# Patient Record
Sex: Male | Born: 1980 | Race: White | Hispanic: No | Marital: Married | Smoking: Current some day smoker
Health system: Southern US, Community
[De-identification: ages and names within clinical notes are randomized; demographics above are authoritative.]

## PROBLEM LIST (undated history)

## (undated) DIAGNOSIS — J45909 Unspecified asthma, uncomplicated: Secondary | ICD-10-CM

## (undated) HISTORY — DX: Unspecified asthma, uncomplicated: J45.909

## (undated) HISTORY — PX: HERNIA REPAIR: SHX51

---

## 2015-11-27 ENCOUNTER — Ambulatory Visit (HOSPITAL_COMMUNITY)
Admission: RE | Admit: 2015-11-27 | Discharge: 2015-11-27 | Disposition: A | Discharge status: Home / Self Care | Source: Ambulatory Visit | Attending: Allergy and Immunology | Admitting: Allergy and Immunology

## 2015-11-27 ENCOUNTER — Ambulatory Visit (INDEPENDENT_AMBULATORY_CARE_PROVIDER_SITE_OTHER): Admitting: Allergy and Immunology

## 2015-11-27 ENCOUNTER — Encounter: Payer: Self-pay | Admitting: Allergy and Immunology

## 2015-11-27 VITALS — BP 110/68 | HR 80 | Temp 98.3°F | Resp 16 | Ht 70.47 in | Wt 220.5 lb

## 2015-11-27 DIAGNOSIS — H1010 Acute atopic conjunctivitis, unspecified eye: Secondary | ICD-10-CM

## 2015-11-27 DIAGNOSIS — J4531 Mild persistent asthma with (acute) exacerbation: Secondary | ICD-10-CM | POA: Diagnosis not present

## 2015-11-27 DIAGNOSIS — J309 Allergic rhinitis, unspecified: Secondary | ICD-10-CM | POA: Diagnosis not present

## 2015-11-27 MED ORDER — FLUTICASONE FUROATE 200 MCG/ACT IN AEPB: 1.0000 | INHALATION_SPRAY | Freq: Every day | RESPIRATORY_TRACT | Status: DC

## 2015-11-27 MED ORDER — ALBUTEROL SULFATE HFA 108 (90 BASE) MCG/ACT IN AERS: 2.0000 | INHALATION_SPRAY | RESPIRATORY_TRACT | Status: DC | PRN

## 2015-11-27 MED ORDER — MONTELUKAST SODIUM 10 MG PO TABS: 10.0000 mg | ORAL_TABLET | Freq: Every day | ORAL | Status: DC

## 2015-11-27 MED ORDER — METHYLPREDNISOLONE ACETATE 80 MG/ML IJ SUSP
80.0000 mg | Freq: Once | INTRAMUSCULAR | Status: AC
  Administered 2015-11-27: 80 mg via INTRAMUSCULAR

## 2015-11-27 NOTE — Progress Notes (Signed)
Dear Dr. Mardelle Matte,  Thank you for referring Nathaniel Lawson to the [facility] [place] on 11/27/2015.   Below is a summation of this patient's evaluation and recommendations.  Thank you for your referral. I will keep you informed about this patient's response to treatment.   If you have any questions please to do hestitate to contact me.   Sincerely,  Jessica Priest, MD [facility]   ______________________________________________________________________    NEW PATIENT NOTE  Referring Provider: Willow Ora, MD Primary Provider: Willow Ora, MD Date of office visit: 11/27/2015    Subjective:   Chief Complaint:  Nathaniel Lawson (DOB: [birth year]) is a 35 y.o. male with a chief complaint of New Patient (Initial Visit)  who presents to the clinic on 11/27/2015 with the following problems:  HPI Comments: Nathaniel Lawson presents to this clinic in evaluation of respiratory tract symptoms. He has a several year history of problems with runny nose and occasional sneezing and occasional nasal congestion without any anosmia and without any headaches and without ugly nasal discharge usually occurring on a perennial basis with seasonal exacerbation during the spring. He's been treated with Flonase and Zyrtec which has helped him somewhat. There is no obvious provoking factors giving rise to his symptoms.  In addition, he's been having cough since January. He feels a tickle in his chest and may be a tickle in his throat. He does not get any chest tightness or shortness of breath. He does not have any throat clearing or postnasal drip or raspy voice. He does not have any gagging or retching or posttussive emesis. There is no history of childhood asthma. He has not had a chest x-ray. No therapy to date has helped this cough.   History reviewed. No pertinent past medical history.  Past Surgical History  Procedure  Laterality Date  . Hernia repair        Medication List           cetirizine 10 MG tablet  Commonly known as:  ZYRTEC  Take 10 mg by mouth daily.     fluticasone 50 MCG/ACT nasal spray  Commonly known as:  FLONASE  Place 2 sprays into both nostrils daily.        No Known Allergies  Review of systems negative except as noted in HPI / PMHx or noted below:  Review of Systems  Constitutional: Negative.   HENT: Negative.   Eyes: Negative.   Respiratory: Negative.   Cardiovascular: Negative.   Gastrointestinal: Negative.   Genitourinary: Negative.   Musculoskeletal: Negative.   Skin: Negative.   Neurological: Negative.   Endo/Heme/Allergies: Negative.   Psychiatric/Behavioral: Negative.     Family History  Problem Relation Age of Onset  . Allergic rhinitis Neg Hx   . Angioedema Neg Hx   . Atopy Neg Hx   . Asthma Neg Hx   . Eczema Neg Hx   . Immunodeficiency Neg Hx   . Urticaria Neg Hx     Social History   Social History  . Marital Status: Married    Spouse Name: N/A  . Number of Children: N/A  . Years of Education: N/A   Occupational History  . Not on file.   Social History Main Topics  . Smoking status: Former Smoker    Quit date: 03/24/2014  . Smokeless tobacco: Not on file  . Alcohol Use: 0.0 oz/week    0 Standard drinks  or equivalent per week  . Drug Use: No  . Sexual Activity: Not on file   Other Topics Concern  . Not on file   Social History Narrative  . 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   Objective:   Filed Vitals:   11/27/15 0842  BP: 110/68  Pulse: 80  Temp: 98.3 F (36.8 C)  Resp: 16   Height: 5' 10.47" (179 cm) Weight: 220 lb 7.4 oz (100 kg)  Physical Exam  Constitutional: He is  well-developed, well-nourished, and in no distress.  HENT:  Head: Normocephalic. Head is without right periorbital erythema and without left periorbital erythema.  Right Ear: Tympanic membrane, external ear and ear canal normal.  Left Ear: Tympanic membrane, external ear and ear canal normal.  Nose: Nose normal. No mucosal edema or rhinorrhea.  Mouth/Throat: Oropharynx is clear and moist and mucous membranes are normal. No oropharyngeal exudate.  Eyes: Conjunctivae and lids are normal. Pupils are equal, round, and reactive to light.  Neck: Trachea normal. No tracheal deviation present. No thyromegaly present.  Cardiovascular: Normal rate, regular rhythm, S1 normal, S2 normal and normal heart sounds.   No murmur heard. Pulmonary/Chest: Effort normal. No stridor. No tachypnea. No respiratory distress. He has no wheezes. He has no rales. He exhibits no tenderness.  Abdominal: Soft. He exhibits no distension and no mass. There is no hepatosplenomegaly. There is no tenderness. There is no rebound and no guarding.  Musculoskeletal: He exhibits no edema or tenderness.  Lymphadenopathy:       Head (right side): No tonsillar adenopathy present.       Head (left side): No tonsillar adenopathy present.    He has no cervical adenopathy.    He has no axillary adenopathy.  Neurological: He is alert. Gait normal.  Skin: No rash noted. He is not diaphoretic. No erythema. No pallor. Nails show no clubbing.  Psychiatric: Mood and affect normal.     Diagnostics: Allergy skin tests were performed. He did not demonstrate any hypersensitivity against a screening panel of aeroallergens or foods  Spirometry was performed and demonstrated an FEV1 of 2.98 @ 68 % of predicted. Following the administration of nebulized albuterol his FEV1 rose to 4.20 which was an increase in the FEV1 of 41%.  The patient had an Asthma Control Test with the following results:  .     Assessment and Plan:    1. Asthma, not well  controlled, mild persistent, with acute exacerbation   2. Allergic rhinoconjunctivitis     1. Allergen avoidance measures?  2. Treat and prevent inflammation:   A. montelukast 10 mg one tablet one time per day  B. Flonase 2 sprays each nostril one time per day  C. Arnuity 200 one inhalation 1 time per day  D. Depo-Medrol 80 IM delivered in clinic today  3. If needed:   A. ProAir HFA 2 puffs every 4-6 hours  B. OTC antihistamine - Claritin/Zyrtec/Allegra  4. Obtain a chest x-ray  5. Return to clinic in 4 weeks or earlier if problem  6. Laryngopharyngeal reflux?  Dhairya has an irritated and inflamed airway and we'll treat him with the therapy noted above and obtain a chest x-ray to rule out a significant condition other  than inflammation contributing to his respiratory tract symptoms. He may have a component of LPR which may need to be addressed if he does not have an adequate response to the medical therapy noted above. I will regroup with him in 4 weeks or earlier if there is a problem.  Jessica PriestEric J. Kozlow, MD [facility] [place] [place]

## 2015-11-27 NOTE — Patient Instructions (Signed)
  1. Allergen avoidance measures  2. Treat and prevent inflammation:   A. montelukast 10 mg one tablet one time per day  B. Flonase 2 sprays each nostril one time per day  C. Arnuity 200 one inhalation 1 time per day  D. Depo-Medrol 80 IM delivered in clinic today  3. If needed:   A. ProAir HFA 2 puffs every 4-6 hours  B. OTC antihistamine - Claritin/Zyrtec/Allegra  4. Obtain a chest x-ray  5. Return to clinic in 4 weeks or earlier if problem  6. Laryngopharyngeal reflux?

## 2015-11-28 ENCOUNTER — Telehealth: Payer: Self-pay

## 2015-11-28 NOTE — Telephone Encounter (Signed)
See result note.  

## 2015-11-28 NOTE — Telephone Encounter (Signed)
Patient was returning phone call for xray results.  Thanks

## 2015-12-04 ENCOUNTER — Telehealth: Payer: Self-pay | Admitting: *Deleted

## 2015-12-04 MED ORDER — FLUTICASONE PROPIONATE HFA 220 MCG/ACT IN AERO: 2.0000 | INHALATION_SPRAY | Freq: Every day | RESPIRATORY_TRACT | Status: DC

## 2015-12-04 NOTE — Telephone Encounter (Signed)
Patient insurance will not cover Arnuity. Per Dr. Lucie LeatherKozlow switch to Floven 220 mcg 2 puffs daily. Sent script into [facility]. Patient informed

## 2015-12-25 ENCOUNTER — Encounter: Payer: Self-pay | Admitting: Allergy and Immunology

## 2015-12-25 ENCOUNTER — Ambulatory Visit (INDEPENDENT_AMBULATORY_CARE_PROVIDER_SITE_OTHER): Admitting: Allergy and Immunology

## 2015-12-25 VITALS — BP 110/68 | HR 78 | Resp 16

## 2015-12-25 DIAGNOSIS — J4530 Mild persistent asthma, uncomplicated: Secondary | ICD-10-CM | POA: Diagnosis not present

## 2015-12-25 DIAGNOSIS — J3089 Other allergic rhinitis: Secondary | ICD-10-CM

## 2015-12-25 MED ORDER — CETIRIZINE HCL 10 MG PO TABS: 10.0000 mg | ORAL_TABLET | Freq: Every day | ORAL | Status: DC

## 2015-12-25 NOTE — Patient Instructions (Signed)
  1. Allergen avoidance measures? - Check Zone 3 allergy profile  2. Treat and prevent inflammation:   A. montelukast 10 mg one tablet one time per day  B. Flonase 2 sprays each nostril one time per day  3. If needed:   A. ProAir HFA 2 puffs every 4-6 hours  B. OTC antihistamine - Claritin/Zyrtec/Allegra  4. Stop Flovent. Can also stop and restart montelukast.  5. Return to clinic in 12 weeks or earlier if problem

## 2015-12-25 NOTE — Progress Notes (Signed)
Follow-up Note  Referring Provider: Willow Ora, MD Primary Provider: Willow Ora, MD Date of Office Visit: 12/25/2015  Subjective:   Nathaniel Lawson (DOB: 1981/02/19) is a 35 y.o. male who [facility] 12[facility]/2015 in re-evaluation of the following:  HPI: Nathaniel Lawson returns to this clinic in reevaluation of his rhinitis and cough. He believes that his nose is basically the same since starting medical therapy prescribed 11/28/2015. He does not think that he's had any additional improvement with the introduction of montelukast compared to using his Zyrtec and Flonase. He no longer has a cough. He has been using an inhaled steroid, initially Arnuity and subsequently Flovent. He thinks that his cough was actually improving before starting any inhaled steroids. He does not believe that he has asthma given the fact that he has no exercise-induced bronchospastic symptoms, cold air induced bronchospastic symptoms and has never had a problem with cough up until January of this year. His chest x-ray obtained on 11/27/2015 identified no significant abnormality    Medication List       This list is accurate as of: 12/25/15  8:56 AM.  Always use your most recent med list.               albuterol 108 (90 Base) MCG/ACT inhaler  Commonly known as:  PROAIR HFA  Inhale 2 puffs into the lungs every 4 (four) hours as needed for wheezing or shortness of breath.     cetirizine 10 MG tablet  Commonly known as:  ZYRTEC  Take 10 mg by mouth daily.     fluticasone 220 MCG/ACT inhaler  Commonly known as:  FLOVENT HFA  Inhale 2 puffs into the lungs daily.     fluticasone 50 MCG/ACT nasal spray  Commonly known as:  FLONASE  Place 2 sprays into both nostrils daily.     montelukast 10 MG tablet  Commonly known as:  SINGULAIR  Take 1 tablet (10 mg total) by mouth at bedtime.        Past Medical History  Diagnosis Date  . Asthma     Past Surgical History    Procedure Laterality Date  . Hernia repair      No Known Allergies  Review of systems negative except as noted in HPI / PMHx or noted below:  Review of Systems  Constitutional: Negative.   HENT: Negative.   Eyes: Negative.   Respiratory: Negative.   Cardiovascular: Negative.   Gastrointestinal: Negative.   Genitourinary: Negative.   Musculoskeletal: Negative.   Skin: Negative.   Neurological: Negative.   Endo/Heme/Allergies: Negative.   Psychiatric/Behavioral: Negative.      Objective:   Filed Vitals:   12/25/15 0844  BP: 110/68  Pulse: 78  Resp: 16          Physical Exam  Constitutional: He is well-developed, well-nourished, and in no distress.  HENT:  Head: Normocephalic.  Right Ear: Tympanic membrane, external ear and ear canal normal.  Left Ear: Tympanic membrane, external ear and ear canal normal.  Nose: Nose normal. No mucosal edema or rhinorrhea.  Mouth/Throat: Uvula is midline, oropharynx is clear and moist and mucous membranes are normal. No oropharyngeal exudate.  Eyes: Conjunctivae are normal.  Neck: Trachea normal. No tracheal tenderness present. No tracheal deviation present. No thyromegaly present.  Cardiovascular: Normal rate, regular rhythm, S1 normal, S2 normal and normal heart sounds.   No murmur heard. Pulmonary/Chest: Breath sounds normal. No stridor. No respiratory distress. He  has no wheezes. He has no rales.  Musculoskeletal: He exhibits no edema.  Lymphadenopathy:       Head (right side): No tonsillar adenopathy present.       Head (left side): No tonsillar adenopathy present.    He has no cervical adenopathy.  Neurological: He is alert. Gait normal.  Skin: No rash noted. He is not diaphoretic. No erythema. Nails show no clubbing.  Psychiatric: Mood and affect normal.    Diagnostics: His chest x-ray obtained on 11/27/2015 identified no significant abnormality   Spirometry was performed and demonstrated an FEV1 of 3.52 at 81 % of  predicted.  The patient had an Asthma Control Test with the following results: ACT Total Score: 24.    Assessment and Plan:   1. Asthma, well controlled, mild persistent   2. Other allergic rhinitis     1. Allergen avoidance measures? - Check Zone 3 allergy profile  2. Treat and prevent inflammation:   A. montelukast 10 mg one tablet one time per day  B. Flonase 2 sprays each nostril one time per day  3. If needed:   A. ProAir HFA 2 puffs every 4-6 hours  B. OTC antihistamine - Claritin/Zyrtec/Allegra  4. Stop Flovent. Can also stop and restart montelukast.  5. Return to clinic in 12 weeks or earlier if problem  I think we now need to have Nathaniel Lawson perform the "reverse experiment" to see if he redevelops significant respiratory tract symptoms as his Flovent is removed. I also am not completely convinced that he has a component of asthma but there are some findings that suggest there may be a component of asthma that was giving rise to his cough. I think we'll have a better idea about what going on after 8-12 weeks and I've asked Nathaniel Lawson to contact me should he have significant problems between now and when he returns to this clinic in 12 weeks. To be complete, we will check a zone 3 allergy profile to see if he does have any IgE antigen specific antibodies directed against allergens.   Laurette SchimkeEric Myria Steenbergen, MD [facility]

## 2015-12-28 LAB — ALLERGENS, ZONE 3
Alternaria Alternata IgE: <0.1 kU/L
Aspergillus Fumigatus IgE: <0.1 kU/L
Bahia Grass IgE: <0.1 kU/L
Cedar, Mountain IgE: <0.1 kU/L
Common Silver Birch IgE: <0.1 kU/L
D002-IGE D FARINAE: 0.11 kU/L — AB
Mucor Racemosus IgE: <0.1 kU/L
Nettle IgE: <0.1 kU/L
Pigweed, Rough IgE: <0.1 kU/L
Plantain, English IgE: <0.1 kU/L
Ragweed, Short IgE: <0.1 kU/L
Stemphylium Herbarum IgE: <0.1 kU/L
T001-IGE MAPLE/BOX ELDER: 0.11 kU/L — AB

## 2016-04-07 ENCOUNTER — Ambulatory Visit: Admitting: Allergy and Immunology

## 2017-02-14 IMAGING — DX DG CHEST 2V
2 series · 2 of 2 positions shown · non-contrast
Comparison: None.

CLINICAL DATA: Acute exacerbation of asthma.

EXAM:
CHEST  2 VIEW

[w chest pa]
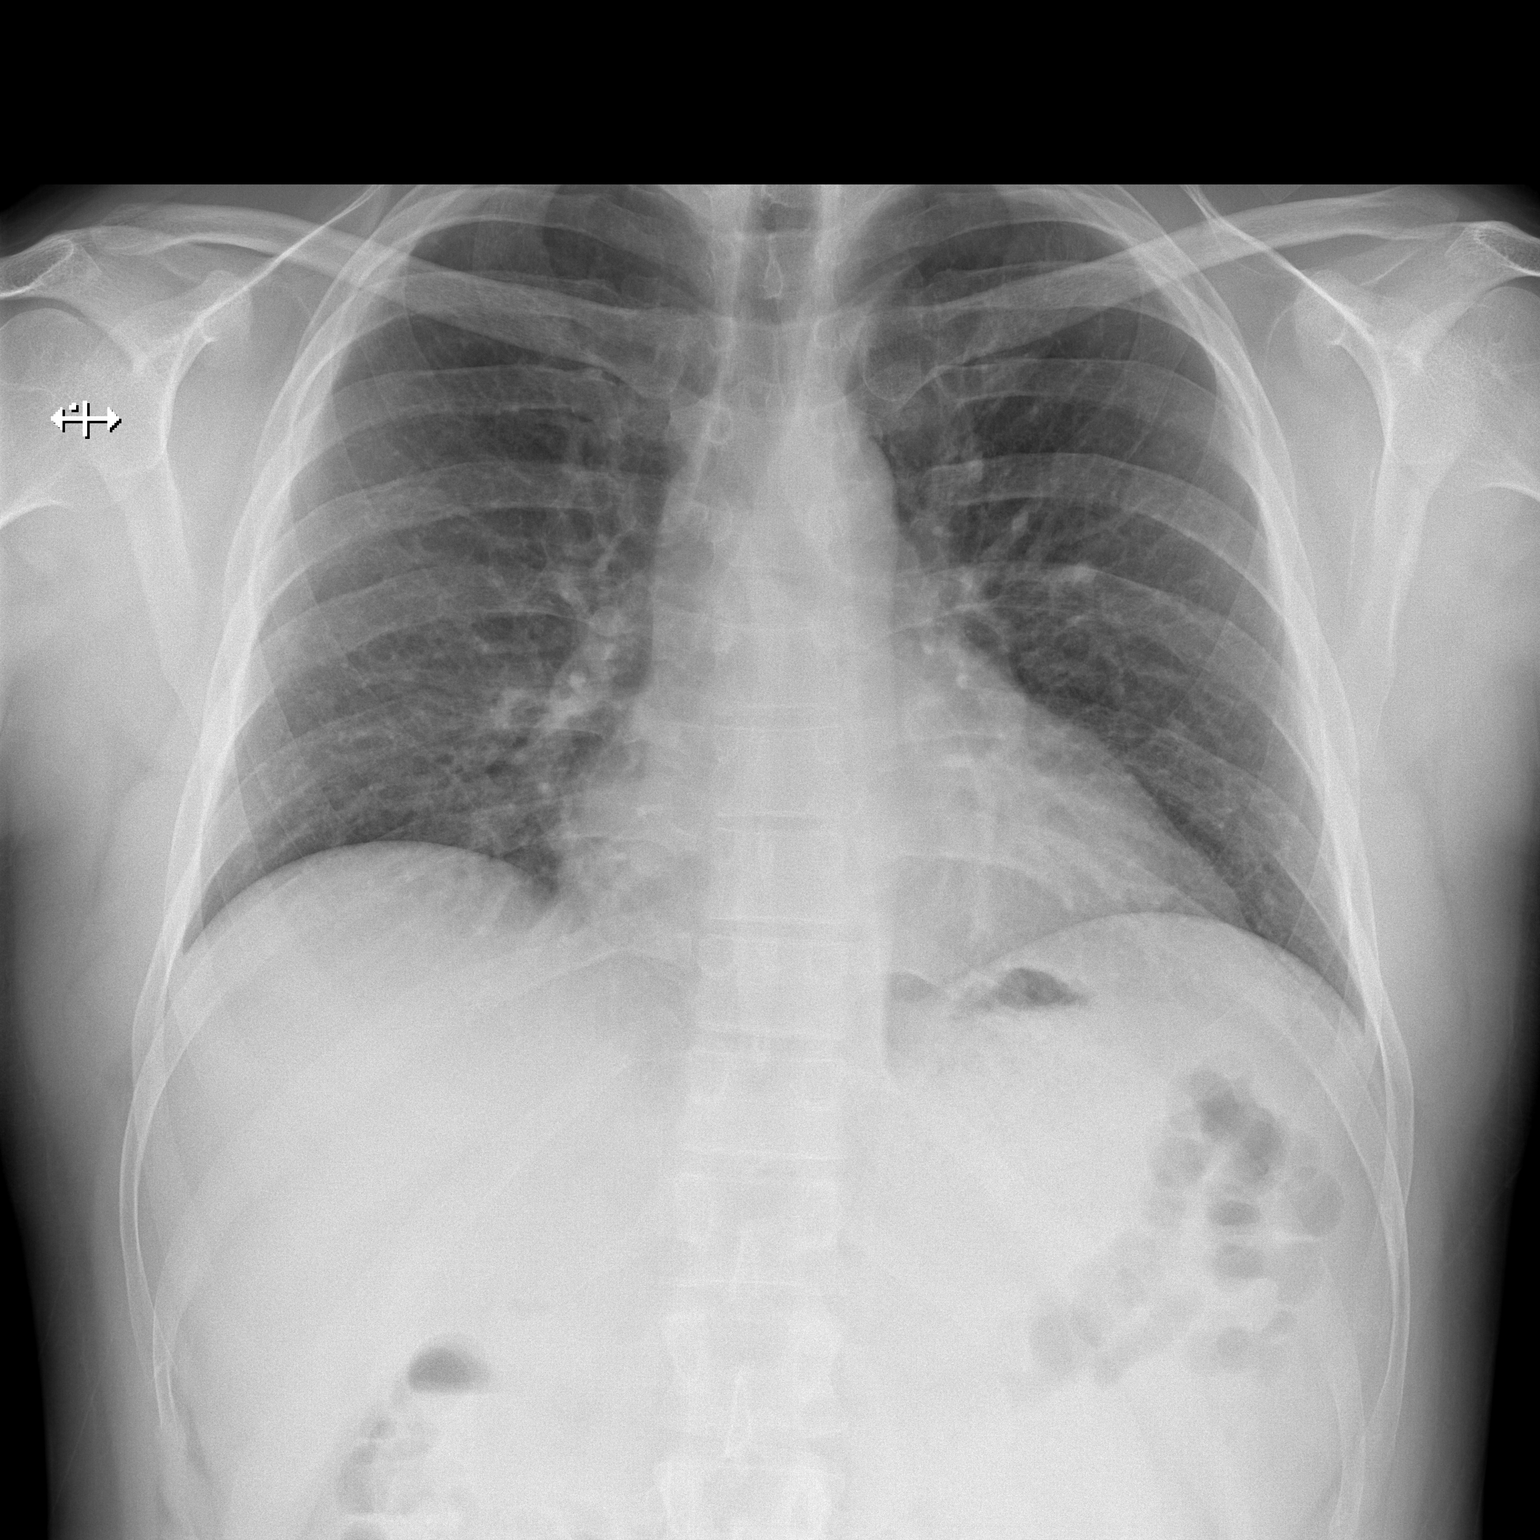

[w chest lat]
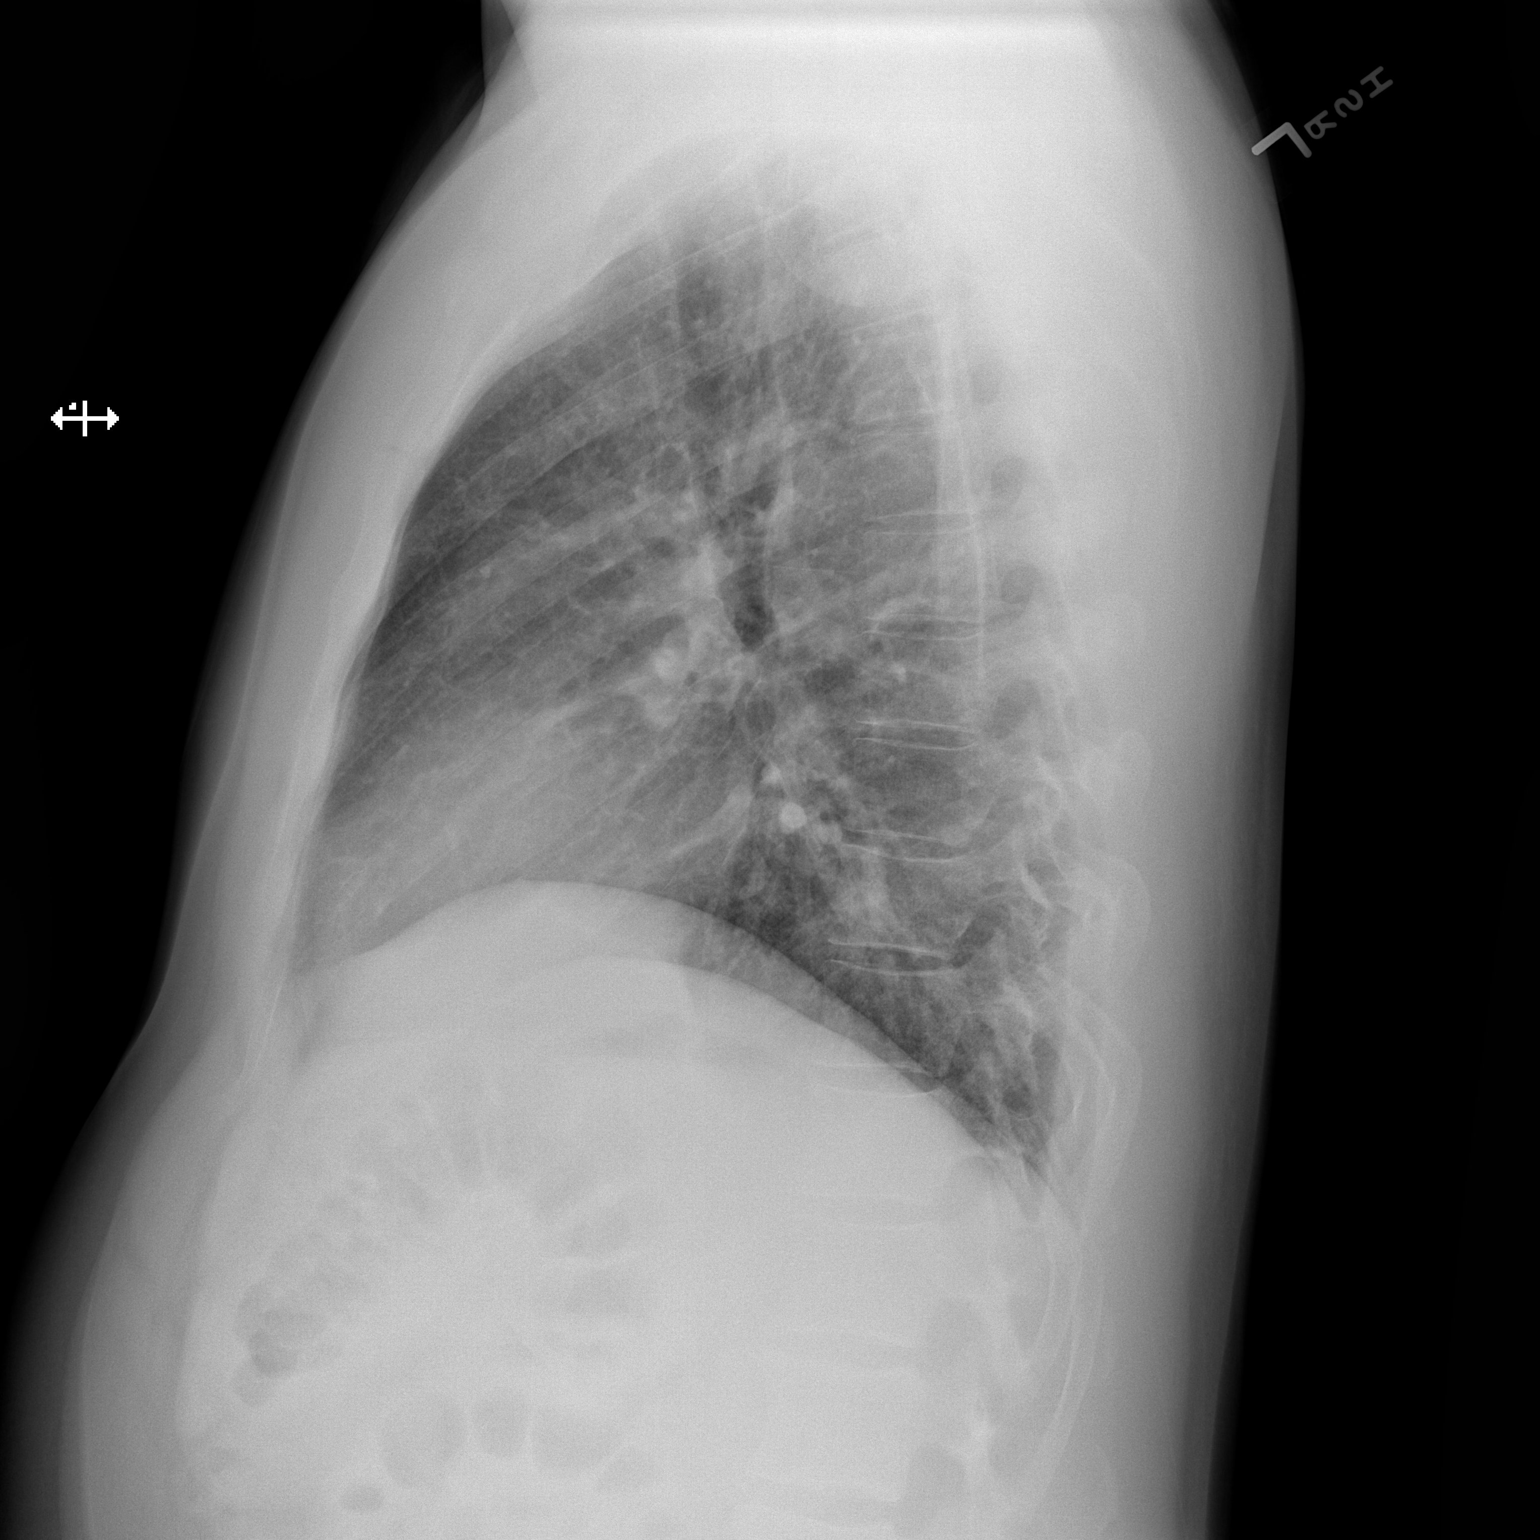

[2 of 2 positions shown; findings below may reference images not displayed]

FINDINGS: Heart size and pulmonary vascularity are normal. No infiltrates or
effusions. Lungs are clear. The bones are normal.
IMPRESSION: Normal exam.

## 2021-09-22 ENCOUNTER — Ambulatory Visit (INDEPENDENT_AMBULATORY_CARE_PROVIDER_SITE_OTHER): Admitting: Behavioral Health

## 2021-09-22 ENCOUNTER — Other Ambulatory Visit: Payer: Self-pay

## 2021-09-22 ENCOUNTER — Encounter: Payer: Self-pay | Admitting: Behavioral Health

## 2021-09-22 VITALS — BP 136/85 | HR 58 | Ht 71.0 in | Wt 215.0 lb

## 2021-09-22 DIAGNOSIS — F489 Nonpsychotic mental disorder, unspecified: Secondary | ICD-10-CM | POA: Diagnosis not present

## 2021-09-22 NOTE — Progress Notes (Signed)
[facility] MD/PA/NP Initial Note  09/22/2021 11:06 AM Tracy Pasley  MRN:  [id number]  Chief Complaint:  Chief Complaint   Establish Care; Patient Education; ADHD     HPI:  41 year old male presents to this office for initial visit and to establish care. He says that he is here today on referral from his PCP. A preliminary assessment tool had revealed he may be borderline ADHD. He says that he performed well through middle and HS with HS being very easy. Says he did not really have to study. Says he was distracted by heavy video game use in his college years and it took him approximately 10 years to complete his degree. This left him feeling discouraged about his accomplishment. Says he wife has been concerned about his memory and forgetfulness with task at home. He does endorse heavy electronic use and cell phone browsing. Says he does browse while driving. He realizes that he probably spends to much time on devices. He says he has been told by other mental health professional that he may suffer from boredom as well.  He is concerned about some of his behaviors and has been concerned about central processing disorder.  He is open to CBT or life coaching to help with possibly maximizing productivity in work and family life. Says his anxiety today is 1/10 and depression is 0/10. He does sleep 7-8 hours most of the time. He denies mania. Was negative on MDQ. No psychosis. No SI/HI.   No prior psychiatric medication trial  Visit Diagnosis:    ICD-10-CM   1. 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       Past Psychiatric History: anxiety and depression  Past Medical History:  Past Medical History:  Diagnosis Date   Asthma     Past Surgical History:  Procedure Laterality Date   HERNIA REPAIR      Family Psychiatric History: none noted this visit  Family History:  Family History  Problem Relation Age of Onset   Allergic rhinitis Neg Hx    Angioedema Neg Hx    Atopy Neg Hx    Asthma Neg Hx     Eczema Neg Hx    Immunodeficiency Neg Hx    Urticaria Neg Hx     Social History:  Social History   Socioeconomic History   Marital status: Married    Spouse name: Janett Billow   Number of children: 2   Years of education: 16   Highest education level: Bachelor's degree (e.g., BA, AB, BS)  Occupational History   Not on file  Tobacco Use   Smoking status: Former    Types: Cigarettes    Quit date: 03/24/2014    Years since quitting: 7.5   Smokeless tobacco: Not on file  Vaping Use   Vaping Use: Never used  Substance and Sexual Activity   Alcohol use: Yes    Alcohol/week: 0.0 standard drinks   Drug use: No   Sexual activity: Yes  Other Topics Concern   Not on file  Social History Narrative   Lives in [place] with wife and two children 55 and 39 years of age. 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    Social Determinants of Health   Financial Resource Strain: Not on file  Food Insecurity: Not on file  Transportation Needs: Not on file  Physical Activity: Not on file  Stress: Not on file  Social Connections: Not on file    Allergies: No Known Allergies  Metabolic Disorder Labs:  No results found for: HGBA1C, MPG No results found for: PROLACTIN No results found for: CHOL, TRIG, HDL, CHOLHDL, VLDL, LDLCALC No results found for: TSH  Therapeutic Level Labs: No results found for: LITHIUM No results found for: VALPROATE No components found for:  CBMZ  Current Medications: Current Outpatient Medications  Medication Sig Dispense Refill   albuterol (PROAIR HFA) 108 (90 Base) MCG/ACT inhaler Inhale 2 puffs into the lungs every 4 (four) hours as needed for wheezing or shortness of breath. 1 Inhaler 1   cetirizine (ZYRTEC) 10 MG tablet Take 10 mg by mouth daily.     cetirizine (ZYRTEC) 10 MG tablet Take 1 tablet (10 mg total) by mouth daily. 30 tablet 5   fluticasone (FLONASE) 50 MCG/ACT nasal spray Place 2 sprays into both nostrils daily.     fluticasone (FLOVENT HFA) 220  MCG/ACT inhaler Inhale 2 puffs into the lungs daily. 1 Inhaler 5   Fluticasone Furoate (ARNUITY ELLIPTA) 200 MCG/ACT AEPB Inhale 1 puff into the lungs daily. 30 each 5   montelukast (SINGULAIR) 10 MG tablet Take 1 tablet (10 mg total) by mouth at bedtime. 30 tablet 5   No current facility-administered medications for this visit.    Medication Side Effects: none  Orders placed this visit:  No orders of the defined types were placed in this encounter.   Psychiatric Specialty Exam:  Review of Systems  Constitutional: Negative.   Allergic/Immunologic: Negative.   Neurological: Negative.   Psychiatric/Behavioral: Negative.     Blood pressure 136/85, pulse (!) 58, height 5\' 11"  (1.803 m), weight 215 lb (97.5 kg).Body mass index is 29.99 kg/m.  General Appearance: Casual and Neat  Eye Contact:  Good  Speech:  Clear and Coherent  Volume:  Normal  Mood:  NA  Affect:  Appropriate  Thought Process:  Coherent  Orientation:  Full (Time, Place, and Person)  Thought Content: Logical   Suicidal Thoughts:  No  Homicidal Thoughts:  No  Memory:  WNL  Judgement:  Good  Insight:  Good  Psychomotor Activity:  Normal  Concentration:  Concentration: Good  Recall:  Good  Fund of Knowledge: Good  Language: Good  Assets:  Desire for Improvement  ADL's:  Intact  Cognition: WNL  Prognosis:  Good   Screenings:  GAD-7    Flowsheet Row Office Visit from 09/22/2021 in [facility]  Total GAD-7 Score 3      PHQ2-9    Tata Timmins [place] Office Visit from 09/22/2021 in [facility]  PHQ-2 Total Score 1       Receiving Psychotherapy: No   Treatment Plan/Recommendations:   Greater than 50% of  60 min face to face time with patient was spent on counseling and coordination of care. We discussed his current symptoms of distractibility and inattentiveness along with his current performance at home and work. We administered the Adult Self Report Scale (ASRS) symptom  checklist along with obtaining historical collateral information about his performance in school as a child though HS. No significant problems were identified and correlated strongly with screening tool.  He scored: Part A: 11 Part B:7 These score are indicative of unlikely diagnosis of ADHD at this time.  He was advised to f/u at his discretion [facility] if he wanted a second opinion and more in depth testing. He was also inquiring about central processing disorder in adults. He is considering possible in depth psych testing. He was negative on GAD7 for anxiety and negative on PHQ-9 for depression.  I discussed with patient that I did not feel that medication therapy was necessary at this time and I was unable to determine any other obvious diagnosis. I did discuss with him possible addictive behaviors with electronic device use. He will decided if he wants to follow up with other testing and will not schedule another follow up here unless there are changes that require necessary intervention. He agrees and understands. He will continue to f/u with PCP on regular basis.   Elwanda Brooklyn, NP

## 2022-08-25 NOTE — Progress Notes (Unsigned)
08/27/22- 45 yoM former smoker for sleep evaluation with concern of loud snoring Medical problem list includes Asthma Epworth score- Body weight today- Covid vax- Flu vax-

## 2022-08-27 ENCOUNTER — Encounter: Payer: Self-pay | Admitting: Internal Medicine

## 2022-08-27 ENCOUNTER — Ambulatory Visit (INDEPENDENT_AMBULATORY_CARE_PROVIDER_SITE_OTHER): Admitting: Internal Medicine

## 2022-08-27 VITALS — BP 112/82 | HR 84 | Ht 71.0 in | Wt 218.4 lb

## 2022-08-27 DIAGNOSIS — R0683 Snoring: Secondary | ICD-10-CM | POA: Diagnosis not present

## 2022-08-27 DIAGNOSIS — J3089 Other allergic rhinitis: Secondary | ICD-10-CM

## 2022-08-27 DIAGNOSIS — J302 Other seasonal allergic rhinitis: Secondary | ICD-10-CM | POA: Diagnosis not present

## 2022-08-27 DIAGNOSIS — G4733 Obstructive sleep apnea (adult) (pediatric): Secondary | ICD-10-CM | POA: Insufficient documentation

## 2022-08-27 NOTE — Assessment & Plan Note (Signed)
Snoring without recognized daytime somnolence, but otherwise suggestive of OSA. Thorough discussion and questions answered. Plan- sleep study. CPAP would be an option if appropriate.

## 2022-08-27 NOTE — Assessment & Plan Note (Signed)
Routine non-sedating antihistamines are apparently sufficient.

## 2022-08-27 NOTE — Patient Instructions (Signed)
Order- schedule Home Sleep Test dx Snoring  Please call us 2 weeks after your sleep study for results and recommendations

## 2022-12-01 ENCOUNTER — Telehealth: Payer: Self-pay | Admitting: Internal Medicine

## 2022-12-01 NOTE — Telephone Encounter (Signed)
Patient would like the nurse to call regarding his sleep kit.  He believes he used it the wrong way and would like more clarification.  Please call patient to discuss further.  CB# [phone]

## 2022-12-02 NOTE — Telephone Encounter (Signed)
ATC X1 LVM for patient to call the office back 

## 2022-12-02 NOTE — Telephone Encounter (Signed)
Pt. Calling back to speak to nurse

## 2022-12-02 NOTE — Telephone Encounter (Signed)
Closing encounter. Since nothing needed.

## 2022-12-02 NOTE — Telephone Encounter (Signed)
Pt. Nathaniel Lawson that he is going to try the HST again so no call back needed he said

## 2022-12-22 ENCOUNTER — Encounter (INDEPENDENT_AMBULATORY_CARE_PROVIDER_SITE_OTHER)

## 2022-12-22 DIAGNOSIS — G4733 Obstructive sleep apnea (adult) (pediatric): Secondary | ICD-10-CM | POA: Diagnosis not present

## 2022-12-22 DIAGNOSIS — R0683 Snoring: Secondary | ICD-10-CM

## 2022-12-26 NOTE — Progress Notes (Deleted)
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 Sends him to sleep on sofa. No fam hx OSA, no ENT surgery. No sleep meds. Much caffeine. Denies daytime somnolence. Can be hard for wife to wake him once asleep.  12/29/22- 42 yoM [employer], occasional cigar smoker followed for mild OSA, complicated by Asthma,  HST 11/24/22- AHI 7/ hr, desaturation to 88%, Body weight 214 lbs Body weight today- For treatment decision   ROS-see HPI   + = positive Constitutional:    weight loss, night sweats, fevers, chills, fatigue, lassitude. HEENT:    headaches, difficulty swallowing, tooth/dental problems, sore throat,       sneezing, itching, ear ache, nasal congestion, post nasal drip, snoring CV:    chest pain, orthopnea, PND, swelling in lower extremities, anasarca,                                  dizziness, palpitations Resp:   shortness of breath with exertion or at rest.                productive cough,   non-productive cough, coughing up of blood.              change in color of mucus.  wheezing.   Skin:    rash or lesions. GI:  No-   heartburn, indigestion, abdominal pain, nausea, vomiting, diarrhea,                 change in bowel habits, loss of appetite GU: dysuria, change in color of urine, no urgency or frequency.   flank pain. MS:   joint pain, stiffness, decreased range of motion, back pain. Neuro-     nothing unusual Psych:  change in mood or affect.  depression or anxiety.   memory loss.  OBJ- Physical Exam  +fit appearing General- Alert, Oriented, Affect-appropriate, Distress- none acute Skin- rash-none, lesions- none, excoriation- none Lymphadenopathy- none Head- atraumatic            Eyes- Gross vision intact, PERRLA, conjunctivae and secretions clear            Ears-  Hearing, canals-normal            Nose- Clear, no-Septal dev, mucus, polyps, erosion, perforation             Throat- Mallampati II-III , mucosa clear , drainage- none, tonsils- atrophic, +teeth Neck- flexible , trachea midline, no stridor , thyroid nl, carotid no bruit Chest - symmetrical excursion , unlabored           Heart/CV- RRR , no murmur , no gallop  , no rub, nl s1 s2                           - JVD- none , edema- none, stasis changes- none, varices- none           Lung- clear to P&A, wheeze- none, cough- none , dullness-none, rub- none           Chest wall-  Abd-  Br/ Gen/ Rectal- Not done, not indicated Extrem- cyanosis- none, clubbing, none, atrophy- none, strength- nl Neuro- grossly intact to observation

## 2022-12-29 ENCOUNTER — Ambulatory Visit (INDEPENDENT_AMBULATORY_CARE_PROVIDER_SITE_OTHER): Admitting: Internal Medicine

## 2022-12-29 ENCOUNTER — Encounter: Payer: Self-pay | Admitting: Internal Medicine

## 2022-12-29 ENCOUNTER — Ambulatory Visit: Admitting: Internal Medicine

## 2022-12-29 VITALS — BP 122/70 | HR 62 | Ht 71.0 in | Wt 214.2 lb

## 2022-12-29 DIAGNOSIS — J302 Other seasonal allergic rhinitis: Secondary | ICD-10-CM | POA: Diagnosis not present

## 2022-12-29 DIAGNOSIS — G4733 Obstructive sleep apnea (adult) (pediatric): Secondary | ICD-10-CM

## 2022-12-29 DIAGNOSIS — J3089 Other allergic rhinitis: Secondary | ICD-10-CM

## 2022-12-29 NOTE — Progress Notes (Addendum)
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 Sends him to sleep on sofa. No fam hx OSA, no ENT surgery. No sleep meds. Much caffeine. Denies daytime somnolence. Can be hard for wife to wake him once asleep.  57/24- 42 yoM [employer], occasional cigar smoker for sleep evaluation with concern of loud snoring Medical problem list includes Allergic Rhinitis HST SNAP 11/24/22- AHI 7/ hr, desaturation to 88%, body weight 214 lbs Body weight today-214 lbs We reviewed his sleep study.  The primary complaint seems to be his loud snoring.  We discussed treatment options in detail.  He had thoughtful questions.  I explained that for scores in his range the issue is control of symptoms. He has only minimal sleep apnea, and interventions such as CPAP are likely to be more intrusive than beneficial. In particular we discussed CPAP and oral appliances versus efforts to sleep off the flat of back and perhaps lose a few pounds.  He is going to discuss with his wife.   A working diagnosis of asthma was made during 2 visits to an Proof[facility] in April and May of 2017, apparently just on the basis of cough. My review of those records suggests appropriate diagnosis in retrospect was just allergic rhinitis with post-nasal drip causing cough. The problem seems to have resolved after those visits, indicating a dx of asthma is not appropriate.   ROS-see HPI   + = positive Constitutional:    weight loss, night sweats, fevers, chills, fatigue, lassitude. HEENT:    headaches, difficulty swallowing, tooth/dental problems, sore throat,       sneezing, itching, ear ache, nasal congestion, post nasal drip, snoring CV:    chest pain, orthopnea, PND, swelling in lower extremities, anasarca,                                   dizziness, palpitations Resp:   shortness of breath with exertion or at rest.                productive cough,   non-productive cough, coughing up of blood.              change in color of mucus.  wheezing.   Skin:    rash or lesions. GI:  No-   heartburn, indigestion, abdominal pain, nausea, vomiting, diarrhea,                 change in bowel habits, loss of appetite GU: dysuria, change in color of urine, no urgency or frequency.   flank pain. MS:   joint pain, stiffness, decreased range of motion, back pain. Neuro-     nothing unusual Psych:  change in mood or affect.  depression or anxiety.   memory loss.  OBJ- Physical Exam  +fit appearing General- Alert, Oriented, Affect-appropriate, Distress- none acute Skin- rash-none, lesions- none, excoriation- none Lymphadenopathy- none Head- atraumatic            Eyes- Gross vision intact, PERRLA, conjunctivae and secretions clear            Ears- Hearing, canals-normal            Nose- Clear, no-Septal dev, mucus, polyps, erosion, perforation  Throat- Mallampati II-III , mucosa clear , drainage- none, tonsils- atrophic, +teeth Neck- flexible , trachea midline, no stridor , thyroid nl, carotid no bruit Chest - symmetrical excursion , unlabored           Heart/CV- RRR , no murmur , no gallop  , no rub, nl s1 s2                           - JVD- none , edema- none, stasis changes- none, varices- none           Lung- clear to P&A, wheeze- none, cough- none , dullness-none, rub- none           Chest wall-  Abd-  Br/ Gen/ Rectal- Not done, not indicated Extrem- cyanosis- none, clubbing, none, atrophy- none, strength- nl Neuro- grossly intact to observation

## 2022-12-29 NOTE — Patient Instructions (Signed)
Let us know if you decide what you would like to do about your mild sleep apnea.

## 2022-12-30 ENCOUNTER — Encounter: Payer: Self-pay | Admitting: Internal Medicine

## 2022-12-31 ENCOUNTER — Other Ambulatory Visit: Payer: Self-pay

## 2022-12-31 DIAGNOSIS — G4733 Obstructive sleep apnea (adult) (pediatric): Secondary | ICD-10-CM

## 2022-12-31 NOTE — Telephone Encounter (Signed)
Order- referral to Dr Althea Grimmer, otolaryngology    consider oral appliance for OSA

## 2022-12-31 NOTE — Assessment & Plan Note (Signed)
Very mild OSA with primary complaint being loud snoring.  Treatment decision issues discussed in detail. Plan-he will discuss with his wife and let us know what he would like to do.

## 2022-12-31 NOTE — Assessment & Plan Note (Signed)
This does not seem to be a significant problem most of the year.  He can manage OTC.

## 2023-03-21 ENCOUNTER — Emergency Department (HOSPITAL_BASED_OUTPATIENT_CLINIC_OR_DEPARTMENT_OTHER)

## 2023-03-21 ENCOUNTER — Emergency Department (HOSPITAL_COMMUNITY)

## 2023-03-21 ENCOUNTER — Observation Stay (HOSPITAL_BASED_OUTPATIENT_CLINIC_OR_DEPARTMENT_OTHER)
Admission: EM | Admit: 2023-03-21 | Discharge: 2023-03-22 | Disposition: A | Discharge status: Home / Self Care | Attending: Internal Medicine | Admitting: Internal Medicine

## 2023-03-21 ENCOUNTER — Other Ambulatory Visit: Payer: Self-pay

## 2023-03-21 ENCOUNTER — Encounter (HOSPITAL_BASED_OUTPATIENT_CLINIC_OR_DEPARTMENT_OTHER): Payer: Self-pay | Admitting: Emergency Medicine

## 2023-03-21 DIAGNOSIS — F17210 Nicotine dependence, cigarettes, uncomplicated: Secondary | ICD-10-CM | POA: Insufficient documentation

## 2023-03-21 DIAGNOSIS — J45909 Unspecified asthma, uncomplicated: Secondary | ICD-10-CM | POA: Diagnosis not present

## 2023-03-21 DIAGNOSIS — Z79899 Other long term (current) drug therapy: Secondary | ICD-10-CM | POA: Insufficient documentation

## 2023-03-21 DIAGNOSIS — E785 Hyperlipidemia, unspecified: Secondary | ICD-10-CM | POA: Insufficient documentation

## 2023-03-21 DIAGNOSIS — R4182 Altered mental status, unspecified: Secondary | ICD-10-CM | POA: Diagnosis not present

## 2023-03-21 DIAGNOSIS — R55 Syncope and collapse: Secondary | ICD-10-CM | POA: Diagnosis not present

## 2023-03-21 LAB — CBC
HCT: 43.4 % (ref 39.0–52.0)
Hemoglobin: 15.2 g/dL (ref 13.0–17.0)
MCH: 30.4 pg (ref 26.0–34.0)
MCHC: 35 g/dL (ref 30.0–36.0)
MCV: 86.8 fL (ref 80.0–100.0)
Platelets: 216 10*3/uL (ref 150–400)
RBC: 5 MIL/uL (ref 4.22–5.81)
RDW: 12.5 % (ref 11.5–15.5)
WBC: 5.3 10*3/uL (ref 4.0–10.5)
nRBC: 0 % (ref 0.0–0.2)

## 2023-03-21 LAB — DIFFERENTIAL
Abs Immature Granulocytes: 0.01 10*3/uL (ref 0.00–0.07)
Basophils Absolute: 0.1 10*3/uL (ref 0.0–0.1)
Basophils Relative: 1 %
Eosinophils Absolute: 0.1 10*3/uL (ref 0.0–0.5)
Eosinophils Relative: 2 %
Immature Granulocytes: 0 %
Lymphocytes Relative: 30 %
Lymphs Abs: 1.6 10*3/uL (ref 0.7–4.0)
Monocytes Absolute: 0.3 10*3/uL (ref 0.1–1.0)
Monocytes Relative: 6 %
Neutro Abs: 3.2 10*3/uL (ref 1.7–7.7)
Neutrophils Relative %: 61 %

## 2023-03-21 LAB — COMPREHENSIVE METABOLIC PANEL
ALT: 18 U/L (ref 0–44)
AST: 20 U/L (ref 15–41)
Albumin: 4.7 g/dL (ref 3.5–5.0)
Alkaline Phosphatase: 77 U/L (ref 38–126)
Anion gap: 9 (ref 5–15)
BUN: 13 mg/dL (ref 6–20)
CO2: 27 mmol/L (ref 22–32)
Calcium: 9.7 mg/dL (ref 8.9–10.3)
Chloride: 102 mmol/L (ref 98–111)
Creatinine, Ser: 1.04 mg/dL (ref 0.61–1.24)
GFR, Estimated: >60 mL/min (ref >60)
Glucose, Bld: 118 mg/dL — ABNORMAL HIGH (ref 70–99)
Potassium: 3.9 mmol/L (ref 3.5–5.1)
Sodium: 138 mmol/L (ref 135–145)
Total Bilirubin: 0.6 mg/dL (ref 0.3–1.2)
Total Protein: 7.9 g/dL (ref 6.5–8.1)

## 2023-03-21 LAB — URINALYSIS, ROUTINE W REFLEX MICROSCOPIC
Bilirubin Urine: NEGATIVE
Glucose, UA: NEGATIVE mg/dL
Hgb urine dipstick: NEGATIVE
Ketones, ur: NEGATIVE mg/dL
Leukocytes,Ua: NEGATIVE
Nitrite: NEGATIVE
Protein, ur: NEGATIVE mg/dL
Specific Gravity, Urine: 1.017 (ref 1.005–1.030)
pH: 7 (ref 5.0–8.0)

## 2023-03-21 LAB — APTT: aPTT: 26 seconds (ref 24–36)

## 2023-03-21 LAB — CBG MONITORING, ED: Glucose-Capillary: 118 mg/dL — ABNORMAL HIGH (ref 70–99)

## 2023-03-21 LAB — RAPID URINE DRUG SCREEN, HOSP PERFORMED
Amphetamines: NOT DETECTED
Barbiturates: NOT DETECTED
Benzodiazepines: NOT DETECTED
Cocaine: NOT DETECTED
Opiates: NOT DETECTED
Tetrahydrocannabinol: NOT DETECTED

## 2023-03-21 LAB — MAGNESIUM: Magnesium: 1.9 mg/dL (ref 1.7–2.4)

## 2023-03-21 LAB — ETHANOL: Alcohol, Ethyl (B): <10 mg/dL (ref <10)

## 2023-03-21 LAB — PROTIME-INR
INR: 1 (ref 0.8–1.2)
Prothrombin Time: 13.2 seconds (ref 11.4–15.2)

## 2023-03-21 LAB — TSH: TSH: 3.843 u[IU]/mL (ref 0.350–4.500)

## 2023-03-21 LAB — PHOSPHORUS: Phosphorus: 2.3 mg/dL — ABNORMAL LOW (ref 2.5–4.6)

## 2023-03-21 MED ORDER — ACETAMINOPHEN 650 MG RE SUPP: 650.0000 mg | Freq: Four times a day (QID) | RECTAL | Status: DC | PRN

## 2023-03-21 MED ORDER — ENOXAPARIN SODIUM 40 MG/0.4ML IJ SOSY
40.0000 mg | PREFILLED_SYRINGE | INTRAMUSCULAR | Status: DC
  Administered 2023-03-21: 40 mg via SUBCUTANEOUS
  Filled 2023-03-21: qty 0.4

## 2023-03-21 MED ORDER — POLYETHYLENE GLYCOL 3350 17 G PO PACK: 17.0000 g | PACK | Freq: Every day | ORAL | Status: DC | PRN

## 2023-03-21 MED ORDER — LORAZEPAM 2 MG/ML IJ SOLN: 1.0000 mg | Freq: Once | INTRAMUSCULAR | Status: DC | PRN

## 2023-03-21 MED ORDER — ACETAMINOPHEN 325 MG PO TABS: 650.0000 mg | ORAL_TABLET | Freq: Four times a day (QID) | ORAL | Status: DC | PRN

## 2023-03-21 NOTE — Procedures (Signed)
[facility]  Stat EEG Report  Video Performed: Performed : No clinical events. Alert and oriented.  Demographics: Patient Name:   Myson, Pense  Date of Birth:   May 27, 1981  Identification Number:   MRN - [id number]  Study Times:  Study Start Time:   03/21/2023 13:26:58 Study End Time:   03/21/2023 14:06:58  Duration:   40 minutes  Indication(s): Syncope  Technical Summary:  This EEG was performed utilizing standard International 10-20 System of electrode placement. One channel electrocardiogram was monitored. Data were obtained and interpreted utilizing referential montage recording, with reformatting to longitudinal, transverse bipolar, and referential montages as necessary for interpretation.  State(s):       Awake      Drowsy   Activation Procedures: Hyperventilation: Not performed Photic Stimulation: Not performed   EEG Description:  Posterior dominant is medium to high amplitude alpha rhythm in the 8-11 hz range. No sleep state is identified. There is no generalized or focal slowing or epileptiform discharges. Video without clinical events. Activation procedures not performed.  Impression:  This is a normal EEG. A normal EEG does not exclude a diagnosis of seizure disorder. Clinical correlation is advised.    Dr Benay Spice      [facility] For Inpatient follow-up with [facility] physician please call [facility] [phone]. This is not an outpatient service. Post hospital discharge, please contact hospital directly.   Please call or reconsult our service if there are any clinical or diagnostic changes.

## 2023-03-21 NOTE — ED Notes (Signed)
The pts wife  has left  the pt is very hungry but he has a npo order until after the [facility]

## 2023-03-21 NOTE — ED Provider Notes (Signed)
  Physical Exam  BP (!) 111/96   Pulse 71   Temp 98.2 F (36.8 C) (Oral)   Resp 15   SpO2 100%   Physical Exam Vitals and nursing note reviewed.  Constitutional:      General: He is not in acute distress.    Appearance: He is well-developed.  HENT:     Head: Normocephalic and atraumatic.  Eyes:     Conjunctiva/sclera: Conjunctivae normal.  Cardiovascular:     Rate and Rhythm: Normal rate and regular rhythm.     Heart sounds: No murmur heard. Pulmonary:     Effort: Pulmonary effort is normal. No respiratory distress.     Breath sounds: Normal breath sounds.  Abdominal:     Palpations: Abdomen is soft.     Tenderness: There is no abdominal tenderness.  Musculoskeletal:        General: No swelling.     Cervical back: Neck supple.     Right lower leg: No edema.     Left lower leg: No edema.  Skin:    General: Skin is warm and dry.     Capillary Refill: Capillary refill takes less than 2 seconds.  Neurological:     General: No focal deficit present.     Mental Status: He is alert and oriented to person, place, and time. Mental status is at baseline.     Cranial Nerves: No cranial nerve deficit.     Sensory: No sensory deficit.     Motor: No weakness.     Coordination: Coordination normal.     Deep Tendon Reflexes: Reflexes normal.     Comments: Forgetful on questioning.  Short-term memory loss.  Psychiatric:        Mood and Affect: Mood normal.     Procedures  Procedures  ED Course / MDM   Clinical Course as of 03/21/23 1516  Sun Mar 21, 2023  1410 Cards: will see for episode of asystole [JD]  1428 Discussed with both cardiology and neurology.  Recommend medicine admission. [JD]    Clinical Course User Index [JD] Laurence Spates, MD   Medical Decision Making Amount and/or Complexity of Data Reviewed Labs: ordered. Radiology: ordered.  Risk Prescription drug management. Decision regarding hospitalization.   Patient presented via transfer from  [facility].  This morning he had sudden onset loss of consciousness followed by confusion for 10 minutes and persistent short-term memory loss.  CT head there was unremarkable as well as workup.  He did have an episode of asystole for several seconds that did not require resuscitation.  He is asymptomatic other than confusion and short-term memory loss.  I discussed the patient with neurology and cardiology who will evaluate here, MRI and EEG are ordered.  Plan for admission to hospitalist.  Patient updated on plan.       Laurence Spates, MD 03/21/23 [phone]

## 2023-03-21 NOTE — ED Provider Notes (Signed)
[place] EMERGENCY DEPARTMENT AT [facility] Provider Note   CSN: [id number] Arrival date & time: 03/21/23  8295     History  Chief Complaint  Patient presents with  . Altered Mental Status    Nathaniel Lawson is a 42 y.o. male.  Consult: At 8: 20 patient was at breakfast with family.  He had had most of a piece of toast and a couple coffee.  From a seated position he lost consciousness and listed off to the side and fell to the floor striking his cheekbone on the left.  He denies any prodromal symptoms but does not really recall what happened.  Family member was present and noted no change until he collapsed to the floor.  Upon regaining consciousness, patient seemed to be at normal baseline.  But within another 10 minutes patient was significantly confused and unable to have any recall.  He had no recall of the event and also has other short-term memory loss.  Patient cannot name the day or the month.  Where that he is having difficulty with short-term recall.  He is never had a similar episode.  He reports he had a very mild headache but no thunderclap headache or severe headache at this time.  No known seizure history.  He has not had any focal motor deficit.  Patient has been ambulatory with a coordinated stable gait.  Family has noted that the memory loss and confusion seems worse than at onset.  No history of drug use.  Occasional verbal with cigar once a week.  Patient lives home with 2 younger children and 2 older stepchildren and his wife.  On a typical day all activities are normal and functional.  No reported psychiatric history or drug use.    Home Medications Prior to Admission medications   Medication Sig Start Date End Date Taking? Authorizing Provider  cetirizine (ZYRTEC) 10 MG tablet Take 10 mg by mouth daily.    [provider]      Allergies    Patient has no known allergies.    Review of Systems   Review of Systems  Physical Exam Updated  Vital Signs BP 129/88   Pulse 65   Temp 98.2 F (36.8 C) (Oral)   Resp 13   SpO2 100%  Physical Exam Constitutional:      Comments: Patient is alert.  Well-nourished well-developed.  No respiratory distress.  Mildly tremulous.  HENT:     Head: Normocephalic and atraumatic.     Comments: No visible face or head trauma.  Slight tenderness at the left zygoma.    Left Ear: Tympanic membrane normal.     Ears:     Comments: Right TM obstructed by cerumen.  Left TM normal.    Nose: Nose normal.     Mouth/Throat:     Mouth: Mucous membranes are moist.     Pharynx: Oropharynx is clear.  Eyes:     Extraocular Movements: Extraocular movements intact.     Conjunctiva/sclera: Conjunctivae normal.     Pupils: Pupils are equal, round, and reactive to light.  Cardiovascular:     Rate and Rhythm: Normal rate and regular rhythm.  Pulmonary:     Effort: Pulmonary effort is normal.     Breath sounds: Normal breath sounds.  Abdominal:     General: There is no distension.     Palpations: Abdomen is soft.     Tenderness: There is no abdominal tenderness. There is no guarding.  Musculoskeletal:  General: No swelling or tenderness. Normal range of motion.     Cervical back: Neck supple.     Right lower leg: No edema.     Left lower leg: No edema.  Skin:    General: Skin is warm and dry.  Neurological:     Comments: Patient is alert.  He is oriented to self.  He is oriented to family members present.  He does not recall the day month or year.  Cranial nerves II through XII intact.  Finger-nose exam normal and coordinated bilaterally.  No pronator drift.  No asterixis.  Patient has slight tremor that is symmetric.  Motor strength 5\5 x 4 extremities.  Speech is clear.  He does not seem to have receptive or expressive aphasia but continues to reiterate that he is aware that he has some short-term memory loss.  He is repeating himself.    ED Results / Procedures / Treatments   Labs (all labs  ordered are listed, but only abnormal results are displayed) Labs Reviewed  CBG MONITORING, ED - Abnormal; Notable for the following components:      Result Value   Glucose-Capillary 118 (*)    All other components within normal limits  ETHANOL  PROTIME-INR  APTT  CBC  DIFFERENTIAL  COMPREHENSIVE METABOLIC PANEL  RAPID URINE DRUG SCREEN, HOSP PERFORMED  URINALYSIS, ROUTINE W REFLEX MICROSCOPIC    EKG EKG Interpretation Date/Time:  Sunday March 21 2023 13:09:59 EDT Ventricular Rate:  74 PR Interval:  162 QRS Duration:  99 QT Interval:  393 QTC Calculation: 436 R Axis:   79  Text Interpretation: Sinus rhythm Probable left atrial enlargement Confirmed by Fulton Reek [phone]) on 03/21/2023 1:11:40 PM  Radiology No results found.  Procedures Procedures   CRITICAL CARE Performed by: Arby Barrette   Total critical care time: 60 minutes  Critical care time was exclusive of separately billable procedures and treating other patients.  Critical care was necessary to treat or prevent imminent or life-threatening deterioration.  Critical care was time spent personally by me on the following activities: development of treatment plan with patient and/or surrogate as well as nursing, discussions with consultants, evaluation of patient's response to treatment, examination of patient, obtaining history from patient or surrogate, ordering and performing treatments and interventions, ordering and review of laboratory studies, ordering and review of radiographic studies, pulse oximetry and re-evaluation of patient's condition.  Medications Ordered in ED Medications - No data to display  ED Course/ Medical Decision Making/ A&P Clinical Course as of 04/01/23 1700  Sun Mar 21, 2023  1410 Cards: will see for episode of asystole [JD]  1428 Discussed with both cardiology and neurology.  Recommend medicine admission. [JD]    Clinical Course User Index [JD] Laurence Spates, MD                              Medical Decision Making Amount and/or Complexity of Data Reviewed Labs: ordered. Radiology: ordered.  Risk Prescription drug management. Decision regarding hospitalization.   Patient is otherwise healthy with no known significant medical history or drug or psychiatric history.  He had an abrupt onset of loss of consciousness with short-term memory loss.  Motor exam is normal.  Cognitive function acutely impaired.  Differential diagnosis includes CVA\TGA\seizure\intracranial neoplasm\conversion reaction.  At this time given significant cognitive and recall change without other apparent etiologies, will activate code stroke and evaluate for CVA.  Time of onset 820.  EKG reviewed by myself normal sinus rhythm.  No ischemic changes normal tracing without previous comparison.  10: 27 radiology notifies that CT head is normal.  Dr. Otelia Limes has done neurology consultation.  At this time plan will be for MRI and EEG at [facility].  Patient remained stable with stable vital signs.  11: 37 patient had bradycardic episode that went to asystole\long sinus pause.  Patient was speaking with family members in bed.  No provoking factors.  He was supine.  No reason for vagal stimulation.  Patient had rebounded and was back to sinus rhythm in the 70s before any intervention.  Patient will be placed on pacer pads.  Atropine at bedside.  Once episode is passed, patient is back to the previous baseline which is alert and talking but very repetitive.    Consult: Reviewed with cardiology.  Will continue to monitor closely have transmitted EKGs.  Will also be seen by cardiology upon transfer to [facility] emergency department.  Pacer pads placed and atropine at bedside.  Patient had 1 additional episode that again, resolved before administering atropine or intervening.  We continue to monitor very closely.  Patient's mental status remained consistent with previous with repetitive  questioning but nonfocal exam.  Patient transferred to emergency department [facility] for advanced diagnostic evaluation and specialty consultations.         Final Clinical Impression(s) / ED Diagnoses Final diagnoses:  Syncope and collapse  Altered mental status, unspecified altered mental status type    Rx / DC Orders ED Discharge Orders     None         Arby Barrette, MD 04/01/23 1709

## 2023-03-21 NOTE — ED Triage Notes (Signed)
Pt was found in the kitchen, wife was at the home, after 5 to 10 seconds patient was awake. Pt has no recollection . Pt does not remember waking up this am, remembers he was in the car on the way here. Pt states he feels confused, pt doesn't remember last of 24 hours. No vision changes, can name common items, can't name a presidential nominee.

## 2023-03-21 NOTE — ED Triage Notes (Signed)
Pt states left side of face and right wrist are sore from fall.

## 2023-03-21 NOTE — ED Notes (Signed)
Pt arrived from [facility]. Pt alert and orientedx 3 to4 fluctuating ability to remember time. Pt states he "feels foggy"

## 2023-03-21 NOTE — ED Notes (Signed)
Per pt's family, pt "passed out" while laying in bed and HR decreased on monitor. Pt was became alert on verbal stimuli when staff entered RM and HR was noted to increase to WNL and GCS 14. EDP was made aware. Monitor was reviewed and showed bradycardia decreasing to a sinus pause.

## 2023-03-21 NOTE — Progress Notes (Signed)
EEG complete - results pending.  TELE

## 2023-03-21 NOTE — Consult Note (Addendum)
[facility] TeleNeurology Consult Services    Date of Service:  03/21/2023       Metrics: Last Known Well: 8:20 AM Symptoms: As per HPI.  Patient is not a candidate for thrombolytic.   Location of the provider: [facility]  Location of the patient: [facility] Pre-Morbid Modified Rankin Scale: 0 Time Code Stroke Page received:  10:04 AM Time neurologist arrived:  10:09 AM Time NIHSS completed: 10:40 AM    This consult was provided via telemedicine with 2-way video and audio communication. The patient/family was informed that care would be provided in this way and agreed to receive care in this manner.   ED Physician notified of diagnostic impression and management plan at 10:45 AM   Assessment: 42 year old male with a PMHx of recurrent unexplained spells of orthostatic syncope, presenting from home after a syncopal spell with a fall, followed on awakening by anterograde memory loss.   - Neurological exam reveals anterograde memory loss and mild right sided sensory numbness. NIHSS 3.  - CT head: Normal noncontrast CT appearance of the brain. ASPECTS 10.  - DDx recurrent syncope: Orthostatic hypotension vs cardiac arrhythmia vs hypoglycemia vs drop attack (epileptic) - DDx anterograde memory loss: Transient global amnesia is felt to be highest on the DDx. Uncommonly, stroke and seizure can mimic this condition     Recommendations: - Transfer to [facility] ED for MRI brain and EEG - Frequent neuro checks - IVF - Syncope work up to include cardiac telemetry and orthostatics, with possible TTE.    Addendum: - EEG has been completed and is normal.  - MRI is pending      ------------------------------------------------------------------------------   History of Present Illness: The patient is a 42 year old male with no significant PMHx who presented to the [facility] ED this morning after he experienced memory loss following a syncopal spell at home. He  was found in the kitchen by his wife unconscious. After 5-10 seconds he was awake. After he awoke, he was confused and could not remember recent events. Wife brought him to the ED where he continued to experience retrograde amnesia, including inability to remember waking up this morning, being unable to remember most of the past 24 hours. In Triage, he had no vision changes, could name common items, but could not name any of the presidential candidates. He was complaining of left face and right wrist soreness from the fall.   He is not sure what caused the fall, but feels that it was most likely a fainting spell. He essentially cannot remember anything from yesterday. 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 He has had one prior seizure in the past, which was associated with passing out.   This is the first time he has ever had a fainting spell accompanied by memory loss.      Past Medical History: Past Medical History:  Diagnosis Date   Asthma      Past Surgical History: Past Surgical History:  Procedure Laterality Date   HERNIA REPAIR       Medications:  No current facility-administered medications on file prior to encounter.   Current Outpatient Medications on File Prior to Encounter  Medication Sig Dispense Refill   cetirizine (ZYRTEC) 10 MG tablet Take 10 mg by mouth daily.          Social History: Drug Use: No EtOH use: Yes  Occasional tobacco use   Family History:  Reviewed in Epic   ROS: As per HPI    Anticoagulant use:  None    Antiplatelet use: None   Examination:    BP 129/88   Pulse 65   Temp 98.2 F (36.8 C) (Oral)   Resp 13   SpO2 100%     1A: Level of Consciousness - 0 1B: Ask Month and Age - 2 1C: Blink Eyes & Squeeze Hands - 0 2: Test Horizontal Extraocular Movements - 0 3: Test Visual Fields - 0 4: Test Facial Palsy (Use Grimace if Obtunded) - 0 5A:  Test Left Arm Motor Drift - 0 5B: Test Right Arm Motor Drift - 0 6A: Test Left Leg Motor Drift - 0 6B: Test Right Leg Motor Drift - 0 7: Test Limb Ataxia (FNF/Heel-Shin) - 0 8: Test Sensation -  1 (FT sensation subjectively slightly decreased to RUE and right calf) 9: Test Language/Aphasia - 0 10: Test Dysarthria - Severe Dysarthria: 0 11: Test Extinction/Inattention - Extinction to bilateral simultaneous stimulation 0   NIHSS Score: 3   Additional testing:  Concentration intact: Able to recite the months of the year backwards without error Memory: Registers 3/3 items and recalls 0/3 after a 4 minute delay. Also unable to recall any of the items with cues or multiple choice format.    Patient/Family was informed the Neurology Consult would occur via TeleHealth consult by way of interactive audio and video telecommunications and consented to receiving care in this manner.   Patient is being evaluated for possible acute neurologic impairment and high pretest probability of imminent or life-threatening deterioration. I spent total of 40 minutes providing care to this patient, including time for face to face visit via telemedicine, review of medical records, imaging studies and discussion of findings with providers, the patient and/or family.   Electronically signed: Dr. Caryl Pina

## 2023-03-21 NOTE — H&P (Signed)
Date: 03/21/2023               Patient Name:  Nathaniel Lawson MRN: [id number]  DOB: [birth year] Age / Sex: 42 y.o., male   PCP: Tracey Harries, MD         Medical Service: Internal Medicine Teaching Service         Attending Physician: Dr. Mercie Eon, MD      First Contact: Dr. Kathleen Lime, MD Pager [phone]    Second Contact: Dr. Modena Slater, DO Pager [phone]         After Hours (After 5p/  First Contact Pager: [phone]  weekends / holidays): Second Contact Pager: [phone]   SUBJECTIVE   Chief Complaint: Syncope  History of Present Illness: This is a 42 year old male with a past medical history of asthma who presents to the emergency department with concerns of syncope.  He states he was doing fine until this episode.  Wife and daughter at bedside who gives most of history, as patient did not remember much.  Per wife, patient was up making breakfast for the children, and himself, and sat down to eat his breakfast.  Upon sitting down, patient had episode where he passed out, fell forward hitting his chest, and then hitting his left cheek on the floor.  He then was out for couple seconds, and was moaning and groaning, but not very responsive.  He then went silent for about a few seconds, and then woke up with a large inhalation.  Wife never started CPR, unclear if patient lost a pulse or not.  Afterwards, patient was up pacing around the house, sitting down intermittently.  Patient's wife then asked the patient if he was okay, and asked when his birthday was, which she was able to tell.  He was then asked by his wife to ask but the kids birthday as well as his age, and he was unable to answer.  At that time, his wife did tell EMS not to come, but given he was altered, one of the older kids drove the patient to the emergency department.  During episode, patient did not have any bowel incontinence, bladder incontinence, or tongue bite.  Of note, patient has had 3 syncopal episodes in the past  greater than 8 years ago.  At one time it was when his knees were locked and he passed out.  The second time was when he was getting an abscess lanced, and the pain made him pass out.  The third time was when he was micturating, and had a syncopal episode.  He has not since had any syncopal episodes, this was the first 1 in more than 8 years.  He has not had any issues with nausea, vomiting, chest pain, shortness of breath.  He has had no recent sick contacts.  No trauma.  He did go to the beach last week, and was doing fine there.  ED Course: Patient initially came to [facility] emergency department, and at that time he was doing fine.  Patient had 2 episodes of asystole and the [facility] emergency department requiring pacer pads to be put on, but never was transcutaneously paced.  Patient never had CPR performed.  Patient went back into sinus rhythm after a few seconds of asystole.  Neurology was consulted, and at the time who recommended MRI and EEG, patient was subsequently transferred to [facility].  Cardiology also evaluated patient who recommended telemetry and echocardiogram. Patient had mostly normal labs as well as normal  imaging during ED course.  Vital signs remained stable once transferred to [facility].   Past Medical History Past Medical History:  Diagnosis Date   Asthma      Meds:  Current Meds  Medication Sig   cetirizine (ZYRTEC) 10 MG tablet Take 10 mg by mouth daily as needed for allergies (during winter months).   Multiple Vitamin (MULTIVITAMIN ADULT PO) Take 1 tablet by mouth daily.    Past Surgical History  Past Surgical History:  Procedure Laterality Date   HERNIA REPAIR      Social:  Lives With: Wife, and 4 kids Occupation: Merchandiser, retail with [employer] Support: Great support at home Level of Function: Independent of all ADLs and IADLs PCP: Dr. Tracey Harries Substances: 1 cigar weekly, 1 bourbon with coke weekly, no other substance  use  Family History: Maternal grandfather with stroke, maternal grandmother with diabetes  Allergies: Allergies as of 03/21/2023   (No Known Allergies)    Review of Systems: A complete ROS was negative except as per HPI.   OBJECTIVE:   Physical Exam: Blood pressure 126/68, pulse 71, temperature 98.2 F (36.8 C), temperature source Oral, resp. rate 10, SpO2 99%.  Constitutional: Well-appearing male resting in bed, in no acute distress. Wife and daughter at bedside. HENT: Normocephalic atraumatic, mucous membranes moist Cardiovascular: regular rate and rhythm, no m/r/g; no LE edema; intact b/l radial pulses Pulmonary/Chest: normal work of breathing on room air, lungs clear to auscultation bilaterally Abdominal: Soft, non-tender, non-distended; positive bowel sounds on auscultation.  Neurological: Alert & oriented to self, location, situation, date. No focal deficits on exam.  Skin: Warm and dry.  Psych: Normal mood and affect.   Labs: CBC    Component Value Date/Time   WBC 5.3 03/21/2023 0952   RBC 5.00 03/21/2023 0952   HGB 15.2 03/21/2023 0952   HCT 43.4 03/21/2023 0952   PLT 216 03/21/2023 0952   MCV 86.8 03/21/2023 0952   MCH 30.4 03/21/2023 0952   MCHC 35.0 03/21/2023 0952   RDW 12.5 03/21/2023 0952   LYMPHSABS 1.6 03/21/2023 0952   MONOABS 0.3 03/21/2023 0952   EOSABS 0.1 03/21/2023 0952   BASOSABS 0.1 03/21/2023 0952     CMP     Component Value Date/Time   NA 138 03/21/2023 0952   K 3.9 03/21/2023 0952   CL 102 03/21/2023 0952   CO2 27 03/21/2023 0952   GLUCOSE 118 (H) 03/21/2023 0952   BUN 13 03/21/2023 0952   CREATININE 1.04 03/21/2023 0952   CALCIUM 9.7 03/21/2023 0952   PROT 7.9 03/21/2023 0952   ALBUMIN 4.7 03/21/2023 0952   AST 20 03/21/2023 0952   ALT 18 03/21/2023 0952   ALKPHOS 77 03/21/2023 0952   BILITOT 0.6 03/21/2023 0952   GFRNONAA >60 03/21/2023 0102    Imaging:  Chest x-ray: No acute pulmonary cardio processes CT head: No  intracranial process appreciated MRI brain: Pending  EKG: personally reviewed my interpretation is normal sinus rhythm, no ST segment changes.  Normal axis.No evidence of QT prolongation.  No prolonged PR interval.  No other acute changes noted.  Previous EKG did show some intermittent sinus pauses.  ASSESSMENT & PLAN:   Assessment & Plan by Problem: Principal Problem:   Syncope   Nathaniel Lawson is a 42 y.o. person living with a history of asthma who presents to the emergency department after syncopal episode.  Patient admitted for further evaluation and management of syncope.  #Syncope  Patient evaluated bedside, with  pacer pads still in place.  Currently imaging and labs do not point towards a diagnosis at this time.  History does sound like patient could have had seizure given altered mental status post episode.  Do not think this is situational syncope given history.  Do not think this is orthostatic syncope given history.  Could be cardiogenic in the setting of bradycardia arrhythmia.  Patient could have also had a seizure as well with concern for postictal state afterwards.  On exam, I do not appreciate any murmurs.  Reviewed telemetry strip, I do not see any sinus pauses. Did consider hypothtyoird but TSH within normal limits. EEG did not show any subclinical seizures.  Patient now required pacer pads at this time.  Unclear etiology on why patient would have cardaic electrical activity dysfunction.  No evidence of MI.  No family history.  Will proceed with echocardiogram.  Neurology following recommending MRI as no structural evidence found on CT head. -Follow-up MRI brain -Continue on telemetry -Neurology following, appreciate recommendation -Cardiology following, appreciate recommendations -Follow-up echocardiogram -Pacer pads currently in place -Atropine at bedside  #HLD Patient has a past medical history of hyperlipidemia.  Total cholesterol 241, triglycerides 260, LDL 149 on  10/07/1022.  ASCVD risk score 6.1%.  No indication for statin at this time. -Lifestyle modifications   #Asthma Patient does have past medical history of asthma.  Patient does not have any symptoms of asthma exacerbation at this time.  No inhalers at home.  Will continue to monitor clinically. -Monitor for any respiratory distress  #Anxiety Patient states that he sometimes gets anxious, but no formal diagnosis of anxiety on chart. -Monitor for any anxiety attacks  Diet: NPO VTE: Enoxaparin IVF: None,None Code: Full  Prior to Admission Living Arrangement: Home, living with wife and kids Anticipated Discharge Location: Home Barriers to Discharge: MRI pending, echo pending, clinical stability  Dispo: Admit patient to Observation with expected length of stay less than 2 midnights.  Signed: Modena Slater, DO Internal Medicine Resident PGY-2 Pager: [phone]  Philomena Doheny, MD Internal Medicine Resident PGY-1  03/21/2023, 4:44 PM   On weekends or after 5pm please page on call intern or resident: First contact: [phone] If no answer in 15 minutes, please contact senior pager at [phone]

## 2023-03-21 NOTE — ED Notes (Signed)
Pt had second episode of momentary LOC along with another prolonged sinus pause, which increased without stiumuli, pt again became alert with verbal stimuli. Pads placed as precaution.

## 2023-03-21 NOTE — Progress Notes (Signed)
Elert 1001  Per bedside, pt had a syncopal episode and since has been altered with short term memory loss (last 24 hours). LNW 0820. mRS 0.  Dr. Otelia Limes paged [phone] Pt to CT 1005 Dr. Otelia Limes on screen 1009 Pt back from CT 1014

## 2023-03-21 NOTE — ED Notes (Signed)
Pacer/defib. Pads are in place. Acuity adjusted accordingly.

## 2023-03-21 NOTE — ED Notes (Signed)
LKW [id number]

## 2023-03-21 NOTE — ED Triage Notes (Signed)
This happened at [facility]

## 2023-03-21 NOTE — Consult Note (Signed)
Cardiology Consultation   Patient ID: Nathaniel Lawson MRN: [id number]; DOB: [birth year]  Admit date: 03/21/2023 Date of Consult: 03/21/2023  PCP:  Tracey Harries, MD   [facility] Providers Cardiologist:  None        Patient Profile:   Nathaniel Lawson is a 42 y.o. male  who is being seen 03/21/2023 for the evaluation of syncope at the request of Marlene Bast.  History of Present Illness:   Nathaniel Lawson has a past history of asthma.  He presented to [facility] with an episode of syncope.  The patient does not remember the episode.  He also does not remember episodes that occurred for 1 week prior.  He was in his usual state of health according to his family.  He had made his son breakfast this morning and was sitting down eating his own breakfast.  His wife saw him fall to the floor.  According to his wife, he hit his cheek but that was not the first thing that hit the floor.  She feels that he landed on his chest.  He was unconscious for a few seconds, began making noise, and then stopped making noise for another 10 seconds.  She feels that he was unconscious for 20 seconds.  He presented to the [facility] emergency room and continued to have episodes of syncope.  Review of telemetry shows a pause on his cardiac monitor of greater than 5 seconds.  There was some sinus slowing prior to the episode.  The patient does have a history of syncope, though his prior episodes occurred when he was dehydrated on a hot day or had an injury.   Past Medical History:  Diagnosis Date   Asthma     Past Surgical History:  Procedure Laterality Date   HERNIA REPAIR       Home Medications:  Prior to Admission medications   Medication Sig Start Date End Date Taking? Authorizing Provider  cetirizine (ZYRTEC) 10 MG tablet Take 10 mg by mouth daily as needed for allergies (during winter months).   Yes [provider]  Multiple Vitamin (MULTIVITAMIN ADULT PO) Take 1  tablet by mouth daily.   Yes [provider]    Inpatient Medications: Scheduled Meds:  Continuous Infusions:  PRN Meds: LORazepam  Allergies:   No Known Allergies  Social History:   Social History   Socioeconomic History   Marital status: Married    Spouse name: Shanda Bumps   Number of children: 2   Years of education: 16   Highest education level: Bachelor's degree (e.g., BA, AB, BS)  Occupational History   Not on file  Tobacco Use   Smoking status: Some Days    Current packs/day: 0.00    Types: Cigars, Cigarettes    Last attempt to quit: 03/24/2014    Years since quitting: 8.9    Passive exposure: Current   Smokeless tobacco: Not on file   Tobacco comments:    Smokes 1 cigar once a week PAP 12/29/22  Vaping Use   Vaping status: Never Used  Substance and Sexual Activity   Alcohol use: Yes    Alcohol/week: 0.0 standard drinks of alcohol   Drug use: No   Sexual activity: Yes  Other Topics Concern   Not on file  Social History Narrative   Lives in [place] with wife and two children 14 and 49 years of age. 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    Social Determinants of Corporate investment banker  Strain: Low Risk  (10/05/2022)   Received from [facility], [facility]   Overall Financial Resource Strain (CARDIA)    Difficulty of Paying Living Expenses: Not hard at all  Food Insecurity: No Food Insecurity (10/05/2022)   Received from [facility], [facility]   Hunger Vital Sign    Worried About Running Out of Food in the Last Year: Never true    Ran Out of Food in the Last Year: Never true  Transportation Needs: No Transportation Needs (10/05/2022)   Received from [facility], [facility]   PRAPARE - Transportation    Lack of Transportation (Medical): No    Lack of Transportation (Non-Medical): No  Physical Activity: Insufficiently Active (10/05/2022)   Received from [facility], [facility]   Exercise Vital Sign    Days of Exercise per Week:  2 days    Minutes of Exercise per Session: 40 min  Stress: No Stress Concern Present (10/05/2022)   Received from [facility], [facility] - Occupational Stress Questionnaire    Feeling of Stress : Not at all  Social Connections: Moderately Integrated (10/05/2022)   Received from [facility], [facility]   Social Network    How would you rate your social network (family, work, friends)?: Adequate participation with social networks  Intimate Partner Violence: Not At Risk (10/05/2022)   Received from [facility], [facility]   HITS    Over the last 12 months how often did your partner physically hurt you?: 1    Over the last 12 months how often did your partner insult you or talk down to you?: 1    Over the last 12 months how often did your partner threaten you with physical harm?: 1    Over the last 12 months how often did your partner scream or curse at you?: 1    Family History:    Family History  Problem Relation Age of Onset   Allergic rhinitis Neg Hx    Angioedema Neg Hx    Atopy Neg Hx    Asthma Neg Hx    Eczema Neg Hx    Immunodeficiency Neg Hx    Urticaria Neg Hx      ROS:  Please see the history of present illness.   All other ROS reviewed and negative.     Physical Exam/Data:   Vitals:   03/21/23 1225 03/21/23 1310 03/21/23 1315 03/21/23 1330  BP: (!) 111/96 130/71 129/79 126/68  Pulse: 71 74 68 71  Resp: 15 (!) 9 12 10   Temp:      TempSrc:      SpO2: 100% 100% 100% 99%   No intake or output data in the 24 hours ending 03/21/23 1506    12/29/2022    1:26 PM 08/27/2022   10:03 AM 09/22/2021   10:37 AM  Last 3 Weights  Weight (lbs) 214 lb 3.2 oz 218 lb 6.4 oz   Weight (kg) 97.16 kg 99.066 kg      Information is confidential and restricted. Go to Review Flowsheets to unlock data.     There is no height or weight on file to calculate BMI.  General:  Well nourished, well developed, in no acute  distress HEENT: normal Neck: no JVD Vascular: No carotid bruits; Distal pulses 2+ bilaterally Cardiac:  normal S1, S2; RRR; no murmur  Lungs:  clear to auscultation bilaterally, no wheezing, rhonchi or rales  Abd: soft, nontender, no hepatomegaly  Ext: no edema Musculoskeletal:  No deformities, BUE and BLE strength normal and equal Skin: warm and dry  Neuro:  CNs 2-12 intact, no focal abnormalities noted Psych:  Normal affect   EKG:  The EKG was personally reviewed and demonstrates: Sinus rhythm Telemetry:  Telemetry was personally reviewed and demonstrates: Sinus rhythm with up to 5-second pause  Relevant CV Studies: Echo pending  Laboratory Data:  High Sensitivity Troponin:  No results for input(s): "TROPONINIHS" in the last 720 hours.   Chemistry Recent Labs  Lab 03/21/23 0952  NA 138  K 3.9  CL 102  CO2 27  GLUCOSE 118*  BUN 13  CREATININE 1.04  CALCIUM 9.7  MG 1.9  GFRNONAA >60  ANIONGAP 9    Recent Labs  Lab 03/21/23 0952  PROT 7.9  ALBUMIN 4.7  AST 20  ALT 18  ALKPHOS 77  BILITOT 0.6   Lipids No results for input(s): "CHOL", "TRIG", "HDL", "LABVLDL", "LDLCALC", "CHOLHDL" in the last 168 hours.  Hematology Recent Labs  Lab 03/21/23 0952  WBC 5.3  RBC 5.00  HGB 15.2  HCT 43.4  MCV 86.8  MCH 30.4  MCHC 35.0  RDW 12.5  PLT 216   Thyroid  Recent Labs  Lab 03/21/23 0959  TSH 3.843    BNPNo results for input(s): "BNP", "PROBNP" in the last 168 hours.  DDimer No results for input(s): "DDIMER" in the last 168 hours.   Radiology/Studies:  DG CHEST PORT 1 VIEW  Result Date: 03/21/2023 CLINICAL DATA:  Syncope. EXAM: PORTABLE CHEST 1 VIEW COMPARISON:  November 27, 2015. FINDINGS: The heart size and mediastinal contours are within normal limits. Both lungs are clear. The visualized skeletal structures are unremarkable. IMPRESSION: No active disease. Electronically Signed   By: Lupita Raider M.D.   On: 03/21/2023 14:48   EEG adult  Result Date:  03/21/2023 Benay Spice, MD     03/21/2023  2:29 PM [facility] Consult Services Stat EEG Report Video Performed: Performed : No clinical events. Alert and oriented. Demographics: Patient Name:   Cleaven, Reuther Date of Birth:   05-21-1981 Identification Number:   MRN - [id number] Study Times: Study Start Time:   03/21/2023 13:26:58 Study End Time:   03/21/2023 14:06:58 Duration:   40 minutes Indication(s): Syncope Technical Summary: This EEG was performed utilizing standard International 10-20 System of electrode placement. One channel electrocardiogram was monitored. Data were obtained and interpreted utilizing referential montage recording, with reformatting to longitudinal, transverse bipolar, and referential montages as necessary for interpretation. State(s):       Awake      Drowsy Activation Procedures: Hyperventilation: Not performed Photic Stimulation: Not performed EEG Description: Posterior dominant is medium to high amplitude alpha rhythm in the 8-11 hz range. No sleep state is identified. There is no generalized or focal slowing or epileptiform discharges. Video without clinical events. Activation procedures not performed. Impression: This is a normal EEG. A normal EEG does not exclude a diagnosis of seizure disorder. Clinical correlation is advised. Dr Benay Spice [facility] For Inpatient follow-up with [facility] physician please call [facility] [phone]. This is not an outpatient service. Post hospital discharge, please contact hospital directly. Please call or reconsult our service if there are any clinical or diagnostic changes.  CT HEAD CODE STROKE WO CONTRAST  Addendum Date: 03/21/2023   ADDENDUM REPORT: 03/21/2023 10:29 ADDENDUM: Study discussed by telephone with Dr. Lebron Conners PFEIFFER on 03/21/2023 at 1025 hours. Electronically Signed   By: Althea Grimmer.D.  On: 03/21/2023 10:29   Result Date: 03/21/2023 CLINICAL DATA:  Code stroke.   42 year old male found down. EXAM: CT HEAD WITHOUT CONTRAST TECHNIQUE: Contiguous axial images were obtained from the base of the skull through the vertex without intravenous contrast. RADIATION DOSE REDUCTION: This exam was performed according to the departmental dose-optimization program which includes automated exposure control, adjustment of the mA and/or kV according to patient size and/or use of iterative reconstruction technique. COMPARISON:  None Available. FINDINGS: Brain: Normal cerebral volume. No midline shift, ventriculomegaly, mass effect, evidence of mass lesion, intracranial hemorrhage or evidence of cortically based acute infarction. Gray-white matter differentiation is within normal limits throughout the brain. Vascular: No suspicious intracranial vascular hyperdensity. Skull: Negative. Sinuses/Orbits: Partially visible left maxillary sinus mucous retention cyst, other Visualized paranasal sinuses and mastoids are clear. Other: Visualized orbits and scalp soft tissues are within normal limits. ASPECTS [facility] Stroke Program Early CT Score) Total score (0-10 with 10 being normal): 10 IMPRESSION: Normal noncontrast CT appearance of the brain.  ASPECTS 10. Electronically Signed: By: Odessa Fleming M.D. On: 03/21/2023 10:20     Assessment and Plan:   Syncope: Has had episodes on telemetry sinus slowing with a pause of up to 5 seconds.  According to his family, this was associated with syncope.  It is certainly possible that his prior episodes of syncope correlate to similar pauses.  He is having short-term memory loss.  EEG pending.  Brent Noto plan for transthoracic echo.  If he continues to have episodes of bradycardia and pausing, may need pacemaker implant.  Patient does not wish to have any procedure done at this time.  Jashon Ishida continue to monitor on telemetry.   Risk Assessment/Risk Scores:      For questions or updates, please contact [facility] Please consult [email or web address] for contact  info under    Signed, Kaeleb Emond Jorja Loa, MD  03/21/2023 3:06 PM

## 2023-03-21 NOTE — Hospital Course (Addendum)
Nathaniel Lawson  Suddenly lost conscious Confused. Short term memory loss.  CT head was normal Labs normal There, asystole, for a couple seconds. Never lost pulse or lost con EEG.  MRI  Cards: sinus pauses   Passing out: 8:25am when he passed out.  He was normal last night and at 8:25 isd when it went different. Mad ehis own breakfast. He also made his four year old breaskfast.  No symptmos.  He tipped out and he hithis chest. Landed fast down. He was innitally moaning. No convusliving or violent shaking. He was making a sound. He then went silent for about a few seconds. He made  a big snoring soud then woke up. Eyes closed for everything. Wife was woiried at one point that he was not breathing. He did not want to sit down and he wanted to walk. 10 mins later, he asked about what happened. He was not sure about who the presoident was and how old his kids were,m and only knew what was his birthday. At [facility]. He had 2 more episodes. Turning hius head [facility] he had some poor vitals and wwent aystolic 2 times. No oxygen. He had fluids at [facility]. No slurirng of speech.  8 years ago he did pass out whe he locked his knees.  10 yearas ago passed out when lacing a wound when he was in pain  Passed out while peeing 9 years ago.  At thentime he never had any memory issues and this was the first time he had memory issues.   He had PT tests a few weeks ago and did not pass out . [place] the [place] and that was compleltly normal. Currently has a headache. Temporal headaches. No blurry vuision. No nasuea or vomiting. No cp/ no issues with urination,  No heart racing or palpitations.   Hisotry: Anxiety, mild OSA, getting mouth guard, no murmurs Meds: Multivitamins, once a month tylenol  Surg: hernia repair, 2020 vasectomy  Allergies: Seasonal allergies   Family: Maternal grandmom:diabetes, maternal granfther stroke.  PCP: Dr. Jeral Fruit  Social:  Ocupation: Supplies sergent   Family : 6 people 4 children, wife, safe at home  Independt in all ADLs and IADLs  Tob: Ciagr 1 time a week  Alc: 1 boiurbon and coke a day Drugs: no drug use  No concern about any STIs  Full code  Wife is POA  10+ years ago did have a heart monitor.  ------------------------------ 07/29: States he does not remember texting his wife. Does not remember anything between 8 AM to 3 PM. Reports he is able to remember events in the hospital. Reports previously when he passed out, their was an identifiable cause and quick recovery.   Wide reports, no identifiable prodrome symptoms. States he was siting on the [employer], and passed out.   ECHO showed EF 55 to 60% MRI result pending  No confusion since yesterday afternoon.  ?May be due to abnormal heart rhythm (arrhythmia).   Pt wants at home cardiac recorder, to catch abnormal hart rhythms. Will need to speak to cards.  PT evaluation with telemetry.

## 2023-03-21 NOTE — ED Notes (Signed)
I called 3e bed is ready going up now

## 2023-03-21 NOTE — ED Notes (Signed)
ED TO INPATIENT HANDOFF REPORT  ED Nurse Name and Phone #: chris  [phone]   S Name/Age/Gender Nathaniel Lawson 42 y.o. male Room/Bed: 033C/033C  Code Status   Code Status: Full Code  Home/SNF/Other Home Patient oriented to: self, place, time, and situation Is this baseline? Yes   Triage Complete: Triage complete  Chief Complaint Syncope [R55]  Triage Note Pt was found in the kitchen, wife was at the home, after 5 to 10 seconds patient was awake. Pt has no recollection . Pt does not remember waking up this am, remembers he was in the car on the way here. Pt states he feels confused, pt doesn't remember last of 24 hours. No vision changes, can name common items, can't name a presidential nominee.   This happened at 0820  Pt states left side of face and right wrist are sore from fall.    Allergies No Known Allergies  Level of Care/Admitting Diagnosis ED Disposition     ED Disposition  Admit   Condition  --   Comment  Hospital Area: [facility] [[id number]]  Level of Care: Telemetry Cardiac [103]  May place patient in observation at [facility] or [facility] if equivalent level of care is available:: No  Covid Evaluation: Asymptomatic - no recent exposure (last 10 days) testing not required  Diagnosis: Syncope [206001]  Admitting Physician: Mercie Eon [[id number]]  Attending Physician: Mercie Eon [[id number]]          B Medical/Surgery History Past Medical History:  Diagnosis Date   Asthma    Past Surgical History:  Procedure Laterality Date   HERNIA REPAIR       A IV Location/Drains/Wounds Patient Lines/Drains/Airways Status     Active Line/Drains/Airways     Name Placement date Placement time Site Days   Peripheral IV 03/21/23 20 G Anterior;Distal;Right;Upper Arm 03/21/23  1001  Arm  less than 1   Peripheral IV 03/21/23 20 G Anterior;Left;Proximal Forearm 03/21/23  1230  Forearm  less than 1            Intake/Output Last 24  hours No intake or output data in the 24 hours ending 03/21/23 1844  Labs/Imaging Results for orders placed or performed during the hospital encounter of 03/21/23 (from the past 48 hour(s))  CBG monitoring, ED     Status: Abnormal   Collection Time: 03/21/23  9:38 AM  Result Value Ref Range   Glucose-Capillary 118 (H) 70 - 99 mg/dL    Comment: Glucose reference range applies only to samples taken after fasting for at least 8 hours.  Ethanol     Status: None   Collection Time: 03/21/23  9:52 AM  Result Value Ref Range   Alcohol, Ethyl (B) <10 <10 mg/dL    Comment: (NOTE) Lowest detectable limit for serum alcohol is 10 mg/dL.  For medical purposes only. Performed at [facility], [address], [place], [place] [address]   Protime-INR     Status: None   Collection Time: 03/21/23  9:52 AM  Result Value Ref Range   Prothrombin Time 13.2 11.4 - 15.2 seconds   INR 1.0 0.8 - 1.2    Comment: (NOTE) INR goal varies based on device and disease states. Performed at [facility], [address] [place], [place] [address]   APTT     Status: None   Collection Time: 03/21/23  9:52 AM  Result Value Ref Range   aPTT 26 24 - 36 seconds  Comment: Performed at [facility], [address] [place], [place] [address]  CBC     Status: None   Collection Time: 03/21/23  9:52 AM  Result Value Ref Range   WBC 5.3 4.0 - 10.5 K/uL   RBC 5.00 4.22 - 5.81 MIL/uL   Hemoglobin 15.2 13.0 - 17.0 g/dL   HCT 60.4 54.0 - 98.1 %   MCV 86.8 80.0 - 100.0 fL   MCH 30.4 26.0 - 34.0 pg   MCHC 35.0 30.0 - 36.0 g/dL   RDW 19.1 47.8 - 29.5 %   Platelets 216 150 - 400 K/uL   nRBC 0.0 0.0 - 0.2 %    Comment: Performed at [facility], [address], [place], [place] [address]  Differential     Status: None   Collection Time: 03/21/23  9:52 AM  Result Value Ref Range   Neutrophils Relative % 61 %   Neutro Abs 3.2 1.7 - 7.7 K/uL    Lymphocytes Relative 30 %   Lymphs Abs 1.6 0.7 - 4.0 K/uL   Monocytes Relative 6 %   Monocytes Absolute 0.3 0.1 - 1.0 K/uL   Eosinophils Relative 2 %   Eosinophils Absolute 0.1 0.0 - 0.5 K/uL   Basophils Relative 1 %   Basophils Absolute 0.1 0.0 - 0.1 K/uL   Immature Granulocytes 0 %   Abs Immature Granulocytes 0.01 0.00 - 0.07 K/uL    Comment: Performed at [facility], [address], [place], [place] [address]  Comprehensive metabolic panel     Status: Abnormal   Collection Time: 03/21/23  9:52 AM  Result Value Ref Range   Sodium 138 135 - 145 mmol/L   Potassium 3.9 3.5 - 5.1 mmol/L   Chloride 102 98 - 111 mmol/L   CO2 27 22 - 32 mmol/L   Glucose, Bld 118 (H) 70 - 99 mg/dL    Comment: Glucose reference range applies only to samples taken after fasting for at least 8 hours.   BUN 13 6 - 20 mg/dL   Creatinine, Ser 4.69 0.61 - 1.24 mg/dL   Calcium 9.7 8.9 - 62.9 mg/dL   Total Protein 7.9 6.5 - 8.1 g/dL   Albumin 4.7 3.5 - 5.0 g/dL   AST 20 15 - 41 U/L   ALT 18 0 - 44 U/L   Alkaline Phosphatase 77 38 - 126 U/L   Total Bilirubin 0.6 0.3 - 1.2 mg/dL   GFR, Estimated >52 >84 mL/min    Comment: (NOTE) Calculated using the CKD-EPI Creatinine Equation (2021)    Anion gap 9 5 - 15    Comment: Performed at [facility], [address] [place], [place] [address]  Magnesium     Status: None   Collection Time: 03/21/23  9:52 AM  Result Value Ref Range   Magnesium 1.9 1.7 - 2.4 mg/dL    Comment: Performed at [facility], [address], [place], [place] [address]  Phosphorus     Status: Abnormal   Collection Time: 03/21/23  9:52 AM  Result Value Ref Range   Phosphorus 2.3 (L) 2.5 - 4.6 mg/dL    Comment: Performed at [facility], [address] [place], [place], [place] [address]  TSH     Status: None   Collection Time: 03/21/23  9:59 AM  Result Value Ref Range   TSH 3.843 0.350 - 4.500 uIU/mL    Comment:  Performed by a 3rd Generation assay with a functional sensitivity of <=  0.01 uIU/mL. Performed at [facility], [address] [place], [place] [address]   Urine rapid drug screen (hosp performed)     Status: None   Collection Time: 03/21/23 11:02 AM  Result Value Ref Range   Opiates NONE DETECTED NONE DETECTED   Cocaine NONE DETECTED NONE DETECTED   Benzodiazepines NONE DETECTED NONE DETECTED   Amphetamines NONE DETECTED NONE DETECTED   Tetrahydrocannabinol NONE DETECTED NONE DETECTED   Barbiturates NONE DETECTED NONE DETECTED    Comment: (NOTE) DRUG SCREEN FOR MEDICAL PURPOSES ONLY.  IF CONFIRMATION IS NEEDED FOR ANY PURPOSE, NOTIFY LAB WITHIN 5 DAYS.  LOWEST DETECTABLE LIMITS FOR URINE DRUG SCREEN Drug Class                     Cutoff (ng/mL) Amphetamine and metabolites    1000 Barbiturate and metabolites    200 Benzodiazepine                 200 Opiates and metabolites        300 Cocaine and metabolites        300 THC                            50 Performed at [facility], [address] [place], [place] [address]   Urinalysis, Routine w reflex microscopic -Urine, Clean Catch     Status: None   Collection Time: 03/21/23 11:02 AM  Result Value Ref Range   Color, Urine YELLOW YELLOW   APPearance CLEAR CLEAR   Specific Gravity, Urine 1.017 1.005 - 1.030   pH 7.0 5.0 - 8.0   Glucose, UA NEGATIVE NEGATIVE mg/dL   Hgb urine dipstick NEGATIVE NEGATIVE   Bilirubin Urine NEGATIVE NEGATIVE   Ketones, ur NEGATIVE NEGATIVE mg/dL   Protein, ur NEGATIVE NEGATIVE mg/dL   Nitrite NEGATIVE NEGATIVE   Leukocytes,Ua NEGATIVE NEGATIVE    Comment: Performed at [facility], [address], [place], [place] [address]   DG CHEST PORT 1 VIEW  Result Date: 03/21/2023 CLINICAL DATA:  Syncope. EXAM: PORTABLE CHEST 1 VIEW COMPARISON:  November 27, 2015. FINDINGS: The heart size and mediastinal contours are within normal limits. Both  lungs are clear. The visualized skeletal structures are unremarkable. IMPRESSION: No active disease. Electronically Signed   By: Lupita Raider M.D.   On: 03/21/2023 14:48   EEG adult  Result Date: 03/21/2023 Benay Spice, MD     03/21/2023  2:29 PM [facility] Stat EEG Report Video Performed: Performed : No clinical events. Alert and oriented. Demographics: Patient Name:   Jarrold, Kohlman Date of Birth:   [birth year] Identification Number:   MRN - [id number] Study Times: Study Start Time:   03/21/2023 13:26:58 Study End Time:   03/21/2023 14:06:58 Duration:   40 minutes Indication(s): Syncope Technical Summary: This EEG was performed utilizing standard International 10-20 System of electrode placement. One channel electrocardiogram was monitored. Data were obtained and interpreted utilizing referential montage recording, with reformatting to longitudinal, transverse bipolar, and referential montages as necessary for interpretation. State(s):       Awake      Drowsy Activation Procedures: Hyperventilation: Not performed Photic Stimulation: Not performed EEG Description: Posterior dominant is medium to high amplitude alpha rhythm in the 8-11 hz range. No sleep state is identified. There is no generalized or focal slowing or epileptiform discharges. Video without clinical events. Activation procedures not performed. Impression: This is  a normal EEG. A normal EEG does not exclude a diagnosis of seizure disorder. Clinical correlation is advised. Dr Benay Spice [facility] For Inpatient follow-up with [facility] physician please call [facility] [phone]. This is not an outpatient service. Post hospital discharge, please contact hospital directly. Please call or reconsult our service if there are any clinical or diagnostic changes.  CT HEAD CODE STROKE WO CONTRAST  Addendum Date: 03/21/2023   ADDENDUM REPORT: 03/21/2023 10:29 ADDENDUM: Study  discussed by telephone with Dr. Lebron Conners PFEIFFER on 03/21/2023 at 1025 hours. Electronically Signed   By: Odessa Fleming M.D.   On: 03/21/2023 10:29   Result Date: 03/21/2023 CLINICAL DATA:  Code stroke.  42 year old male found down. EXAM: CT HEAD WITHOUT CONTRAST TECHNIQUE: Contiguous axial images were obtained from the base of the skull through the vertex without intravenous contrast. RADIATION DOSE REDUCTION: This exam was performed according to the departmental dose-optimization program which includes automated exposure control, adjustment of the mA and/or kV according to patient size and/or use of iterative reconstruction technique. COMPARISON:  None Available. FINDINGS: Brain: Normal cerebral volume. No midline shift, ventriculomegaly, mass effect, evidence of mass lesion, intracranial hemorrhage or evidence of cortically based acute infarction. Gray-white matter differentiation is within normal limits throughout the brain. Vascular: No suspicious intracranial vascular hyperdensity. Skull: Negative. Sinuses/Orbits: Partially visible left maxillary sinus mucous retention cyst, other Visualized paranasal sinuses and mastoids are clear. Other: Visualized orbits and scalp soft tissues are within normal limits. ASPECTS [facility] Stroke Program Early CT Score) Total score (0-10 with 10 being normal): 10 IMPRESSION: Normal noncontrast CT appearance of the brain.  ASPECTS 10. Electronically Signed: By: Odessa Fleming M.D. On: 03/21/2023 10:20    Pending Labs Unresulted Labs (From admission, onward)     Start     Ordered   03/22/23 0500  CBC  Tomorrow morning,   R        03/21/23 1618   03/22/23 0500  Basic metabolic panel  Tomorrow morning,   R        03/21/23 1618   03/22/23 0500  HIV Antibody (routine testing w rflx)  (HIV Antibody (Routine testing w reflex) panel)  Tomorrow morning,   R        03/21/23 1621            Vitals/Pain Today's Vitals   03/21/23 1700 03/21/23 1715 03/21/23 1730 03/21/23 1800  BP:  128/76 126/76 130/87 115/88  Pulse: (!) 57 60 (!) 59 68  Resp: 11 15 10 14   Temp:      TempSrc:      SpO2: 99% 100% 96% 96%  PainSc:        Isolation Precautions No active isolations  Medications Medications  LORazepam (ATIVAN) injection 1 mg (has no administration in time range)  enoxaparin (LOVENOX) injection 40 mg (has no administration in time range)  acetaminophen (TYLENOL) tablet 650 mg (has no administration in time range)    Or  acetaminophen (TYLENOL) suppository 650 mg (has no administration in time range)  polyethylene glycol (MIRALAX / GLYCOLAX) packet 17 g (has no administration in time range)    Mobility walks     Focused Assessments Cardiac Assessment Handoff:  Cardiac Rhythm: Normal sinus rhythm No results found for: "CKTOTAL", "CKMB", "CKMBINDEX", "TROPONINI" No results found for: "DDIMER" Does the Patient currently have chest pain? No    R Recommendations: See Admitting Provider Note  Report given to:   Additional Notes:

## 2023-03-22 ENCOUNTER — Observation Stay (HOSPITAL_BASED_OUTPATIENT_CLINIC_OR_DEPARTMENT_OTHER)

## 2023-03-22 ENCOUNTER — Observation Stay (HOSPITAL_COMMUNITY)

## 2023-03-22 DIAGNOSIS — R4182 Altered mental status, unspecified: Secondary | ICD-10-CM

## 2023-03-22 DIAGNOSIS — R55 Syncope and collapse: Secondary | ICD-10-CM

## 2023-03-22 MED ORDER — GADOBUTROL 1 MMOL/ML IV SOLN
10.0000 mL | Freq: Once | INTRAVENOUS | Status: AC | PRN
  Administered 2023-03-22: 10 mL via INTRAVENOUS

## 2023-03-22 NOTE — Progress Notes (Signed)
Neurology Progress Note  Brief HPI: 42 y.o. male with past medical history of asthma who presents to the ED for evaluation of a syncopal event. Per wife at the bedside, she states yesterday morning he was making breakfast and had sat down at the table to eat. While sitting down he passed out and fell on the floor, had some groaning sounds and was unresponsive for a brief period, then went silent and woke suddenly with a big breath. Immediately afterwards he got himself up off the floor and was walking around the house. There was no incontinence of urine or stool or tongue biting. His wife started to ask him questions about birthdays and ages and he was unable to state the correct information, could not remember they had just gone to the beach.  He states he has had 3 prior episodes in his life where he has passed out. One time when his knees were locked, one time due to pain when he was having a abscess lanced and another time when he was using the bathroom. No memory loss associated with these events.  In the ED he was noted to have 2 episodes of asystole.   S:// His wife is at the bedside. He feels well and back to baseline. He state he is only missing events from yesterday til about 3pm.   Per patient he now remembers having gone to the beach and states he is missing much of yesterday from ~815 to 3pm yesterday. He can vaguely remember making breakfast. He does not remember if he had any symptoms prior to event.   He has not had any more syncopal events overnight and no further memory issues.   CT head was normal. MRI brain no acute process.  EEG was within normal limits, no generalized or focal slowing or epileptiform discharges.      O:// Current vital signs: BP 130/85 (BP Location: Left Arm)   Pulse 72   Temp 99.1 F (37.3 C) (Oral)   Resp 20   Ht 5\' 11"  (1.803 m)   Wt 92.6 kg   SpO2 98%   BMI 28.47 kg/m  Vital signs in last 24 hours: Temp:  [98.2 F (36.8 C)-99.1 F (37.3 C)]  99.1 F (37.3 C) (07/29 1144) Pulse Rate:  [57-77] 72 (07/29 1144) Resp:  [9-20] 20 (07/29 1144) BP: (93-136)/(66-97) 130/85 (07/29 1144) SpO2:  [96 %-100 %] 98 % (07/29 1144) Weight:  [91.7 kg-92.6 kg] 92.6 kg (07/29 0445)  GENERAL: Awake, alert in NAD HEENT: - Normocephalic and atraumatic, dry mm LUNGS - Clear to auscultation bilaterally with no wheezes CV - S1S2 RRR, no m/r/g, equal pulses bilaterally. ABDOMEN - Soft, nontender, nondistended with normoactive BS Ext: warm, well perfused, intact peripheral pulses, no edema NEURO:  Mental Status: AA&Ox4 Language: speech is clear  Naming, repetition, fluency, and comprehension intact. Cranial Nerves: PERRL  EOMI, visual fields full, no facial asymmetry, facial sensation intact, hearing intact, tongue/uvula/soft palate midline, normal sternocleidomastoid and trapezius muscle strength. No evidence of tongue atrophy or fibrillations Motor: 5/5 in all 4 extremities Tone: is normal and bulk is normal Sensation- Intact to light touch bilaterally Coordination: FTN intact bilaterally, no ataxia in BLE. Gait- deferred    Medications  Current Facility-Administered Medications:    acetaminophen (TYLENOL) tablet 650 mg, 650 mg, Oral, Q6H PRN **OR** acetaminophen (TYLENOL) suppository 650 mg, 650 mg, Rectal, Q6H PRN, Allena Katz, Amar, DO   LORazepam (ATIVAN) injection 1 mg, 1 mg, Intravenous, Once PRN, Arby Barrette, MD  polyethylene glycol (MIRALAX / GLYCOLAX) packet 17 g, 17 g, Oral, Daily PRN, Modena Slater, DO  Labs CBC    Component Value Date/Time   WBC 6.8 03/22/2023 0633   RBC 4.73 03/22/2023 0633   HGB 14.5 03/22/2023 0633   HCT 41.6 03/22/2023 0633   PLT 222 03/22/2023 0633   MCV 87.9 03/22/2023 0633   MCH 30.7 03/22/2023 0633   MCHC 34.9 03/22/2023 0633   RDW 12.6 03/22/2023 0633   LYMPHSABS 1.6 03/21/2023 0952   MONOABS 0.3 03/21/2023 0952   EOSABS 0.1 03/21/2023 0952   BASOSABS 0.1 03/21/2023 0952    CMP     Component  Value Date/Time   NA 136 03/22/2023 0633   K 3.8 03/22/2023 0633   CL 106 03/22/2023 0633   CO2 24 03/22/2023 0633   GLUCOSE 101 (H) 03/22/2023 0633   BUN 8 03/22/2023 0633   CREATININE 1.08 03/22/2023 0633   CALCIUM 9.1 03/22/2023 0633   PROT 7.9 03/21/2023 0952   ALBUMIN 4.7 03/21/2023 0952   AST 20 03/21/2023 0952   ALT 18 03/21/2023 0952   ALKPHOS 77 03/21/2023 0952   BILITOT 0.6 03/21/2023 0952   GFRNONAA >60 03/22/2023 4098    Lipid Panel  No results found for: "CHOL", "TRIG", "HDL", "CHOLHDL", "VLDL", "LDLCALC", "LDLDIRECT"  No results found for: "HGBA1C"   Imaging I have reviewed images in epic and the results pertinent to this consultation are:  CT-head Normal noncontrast CT appearance of the brain. ASPECTS 10.    MRI examination of the brain No evidence of an acute intracranial abnormality. No specific seizure etiology is identified. 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  Bilateral maxillary sinus mucous retention cyst    Echo: EF 55-60%.    EEG  EEG Description:  Posterior dominant is medium to high amplitude alpha rhythm in the 8-11 hz range. No sleep state is identified. There is no generalized or focal slowing or epileptiform discharges. Video without clinical events. Activation procedures not performed.  Impression:  This is a normal EEG. A normal EEG does not exclude a diagnosis of seizure disorder. Clinical correlation is advised.  Assessment:  42 y.o. male with past medical history of asthma who presents to the ED for evaluation of a syncopal event and memory loss.   DDX: post concussion, vs seizure, vs  transient global amnesia, vs syncope with acute hypoxic event, medical management would be the same for all, conservative.  Recommendations: - No driving until cleared by MD  - Patient may be discharged from neurology standpoint.  - Neurology will sign off. Please call with questions or concerns  Gevena Mart DNP, ACNPC-AG  [facility]  I have seen the patient reviewed the above note.  He had an amnestic episode after an episode of syncope where he struck his face.  My suspicion is that this most likely represents a concussive episode given that this would fit with his clinical appearance and improvement.  Stunned cerebrum associated with post hypoxic event from a prolonged cardiac pause would be a consideration, but it would be exceedingly unlikely as typically in order to have those kinds of findings, the patient is unresponsive for much longer period of time.  Provoked seizure due to hypoperfusion be another consideration, but the prolonged episode of amnesia would be unusual with this as well.  The clinical description of lucidity associated with anterograde amnesia would be consistent with the clinical appearance of transient global amnesia, but this would essentially  be giving him two separate diagnoses.  Stress can precipitate TGA, but this also occurs typically in patients older than him.  With negative MRI and EEG, I would favor observation with driving prohibitions.  If he were to have further recurrent episodes of amnesia, further evaluation with repeat EEGs and imaging could be pursued.  This is to say, that my suspicion is that this represents concussion resulting from syncope.  No further inpatient recommendations at this time, please call with further questions or concerns.  Ritta Slot, MD [facility] [phone]  If 7pm- 7am, please page neurology on call as listed in AMION.

## 2023-03-22 NOTE — Plan of Care (Signed)
?  Problem: Clinical Measurements: ?Goal: Will remain free from infection ?Outcome: Progressing ?  ?Problem: Clinical Measurements: ?Goal: Diagnostic test results will improve ?Outcome: Progressing ?  ?

## 2023-03-22 NOTE — Evaluation (Signed)
Physical Therapy Evaluation and DISCHARGE Patient Details Name: Nathaniel Lawson MRN: [id number] DOB: Jun 26, 1981 Today's Date: 03/22/2023  History of Present Illness  Pt is a 42yo male who presented to ED with syncopal episodes. MRI/CT unremarkable. PMH: asthma PSH: hernia repair   Clinical Impression  Pt admitted with syncopal episode. Pt now functioning at baseline. Pt able to walk briskly and run up stairs without difficulty. Denies lightheadedness or irregular heart palpitations. Pt with no further acute PT needs at this time. PT SIGNING OFF. Please re-consult if needed in future.         If plan is discharge home, recommend the following:     Can travel by private vehicle        Equipment Recommendations None recommended by PT  Recommendations for Other Services       Functional Status Assessment Patient has had a recent decline in their functional status and demonstrates the ability to make significant improvements in function in a reasonable and predictable amount of time.     Precautions / Restrictions Precautions Precautions: None Precaution Comments: h/o syncopal episodes Restrictions Weight Bearing Restrictions: No      Mobility  Bed Mobility               General bed mobility comments: pt up in chair upon PT arrival    Transfers Overall transfer level: Independent Equipment used: None               General transfer comment: no difficulty    Ambulation/Gait Ambulation/Gait assistance: Independent Gait Distance (Feet): 600 Feet Assistive device: None Gait Pattern/deviations: WFL(Within Functional Limits) Gait velocity: wfl Gait velocity interpretation: >4.37 ft/sec, indicative of normal walking speed   General Gait Details: walked briskly, HR stayed between 90-95bpm, no report of dizziness  Stairs Stairs: Yes Stairs assistance: Independent Stair Management: No rails, Forwards Number of Stairs: 20 General stair comments: quick pace,  near running, HR increased to 128bpm, dec to 108 by the time we got back to his room, and then back to 90 s/p 1 min of sitting in chair  Wheelchair Mobility     Tilt Bed    Modified Rankin (Stroke Patients Only)       Balance Overall balance assessment: Independent                                           Pertinent Vitals/Pain Pain Assessment Pain Assessment: No/denies pain    Home Living Family/patient expects to be discharged to:: Private residence Living Arrangements: Spouse/significant other;Children (8yo and 4yo, 18yo twins) Available Help at Discharge: Family;Available 24 hours/day Type of Home: House Home Access: Stairs to enter Entrance Stairs-Rails: Right Entrance Stairs-Number of Steps: 5 Alternate Level Stairs-Number of Steps: flight Home Layout: Multi-level Home Equipment: None      Prior Function Prior Level of Function : Independent/Modified Independent;Working/employed;Driving             Mobility Comments: indep, runs and row regular, just did a PT test for the [employer] 2 weeks ago ADLs Comments: indep     Hand Dominance   Dominant Hand: Right    Extremity/Trunk Assessment   Upper Extremity Assessment Upper Extremity Assessment: Overall WFL for tasks assessed    Lower Extremity Assessment Lower Extremity Assessment: Overall WFL for tasks assessed    Cervical / Trunk Assessment Cervical / Trunk Assessment: Normal  Communication   Communication:  No difficulties  Cognition Arousal/Alertness: Awake/alert Behavior During Therapy: WFL for tasks assessed/performed Overall Cognitive Status: Within Functional Limits for tasks assessed                                          General Comments General comments (skin integrity, edema, etc.): VSS    Exercises     Assessment/Plan    PT Assessment Patient does not need any further PT services  PT Problem List         PT Treatment Interventions      PT  Goals (Current goals can be found in the Care Plan section)  Acute Rehab PT Goals Patient Stated Goal: home PT Goal Formulation: All assessment and education complete, DC therapy    Frequency       Co-evaluation               AM-PAC PT "6 Clicks" Mobility  Outcome Measure Help needed turning from your back to your side while in a flat bed without using bedrails?: None Help needed moving from lying on your back to sitting on the side of a flat bed without using bedrails?: None Help needed moving to and from a bed to a chair (including a wheelchair)?: None Help needed standing up from a chair using your arms (e.g., wheelchair or bedside chair)?: None Help needed to walk in hospital room?: None Help needed climbing 3-5 steps with a railing? : None 6 Click Score: 24    End of Session   Activity Tolerance: Patient tolerated treatment well Patient left: in chair;with family/visitor present Nurse Communication: Mobility status PT Visit Diagnosis: Dizziness and giddiness (R42)    Time: 1308-6578 PT Time Calculation (min) (ACUTE ONLY): 11 min   Charges:   PT Evaluation $PT Eval Low Complexity: 1 Low   PT General Charges $$ ACUTE PT VISIT: 1 Visit         Lewis Shock, PT, DPT Acute Rehabilitation Services Secure chat preferred Office #: [phone]   Iona Hansen 03/22/2023, 1:53 PM

## 2023-03-22 NOTE — Discharge Instructions (Addendum)
Dear Mr Nathaniel Lawson, Schorer were admitted on 28 July due to concerns of syncope.  You were admitted, cardiology and neurology were both consulted.  The workup including heart ultrasound  and MRI of your brain did not show any abnormalities in the structure of your heart or its electrical conductivity.  He a few things you would like you to follow up on.  - You have an appointment with your cardiologist on 04/28/2023 @ 11:40 am - Avoid operating a motor vehicle until after 6 months after you are cleared by a cardiologist which everyone comes soon - We didn't make any medications changes at this time   Thank you for allowing [place] to see you.   Thank you, Dr. Mickie Bail

## 2023-03-22 NOTE — Progress Notes (Addendum)
  Patient Name: Nathaniel Lawson Date of Encounter: 03/22/2023  Primary Cardiologist: None Electrophysiologist: New  Interval Summary   The patient is doing well today. No further syncope or near syncope. Does have ongoing intermittent memory loss. Only remembers from about 2pm until yesterday evening.  Looks back at his texts and doesn't remember sending them.  Inpatient Medications    Scheduled Meds:  Continuous Infusions:  PRN Meds: acetaminophen **OR** acetaminophen, LORazepam, polyethylene glycol   Vital Signs    Vitals:   03/21/23 1830 03/21/23 2032 03/22/23 0107 03/22/23 0445  BP:  (!) 119/97 113/70 119/77  Pulse:  77 64 74  Resp:  16 18 18   Temp: 98.3 F (36.8 C) 99 F (37.2 C) 98.2 F (36.8 C) 98.3 F (36.8 C)  TempSrc:  Oral Oral Oral  SpO2:  99% 96% 96%  Weight:  91.7 kg  92.6 kg  Height:  5\' 11"  (1.803 m)      Intake/Output Summary (Last 24 hours) at 03/22/2023 0710 Last data filed at 03/22/2023 0458 Gross per 24 hour  Intake 300 ml  Output 2 ml  Net 298 ml   Filed Weights   03/21/23 2032 03/22/23 0445  Weight: 91.7 kg 92.6 kg    Physical Exam    GEN- The patient is well appearing, alert and oriented x 3 today.   Lungs- Clear to ausculation bilaterally, normal work of breathing Cardiac- Regular rate and rhythm, no murmurs, rubs or gallops GI- soft, NT, ND, + BS Extremities- no clubbing or cyanosis. No edema  Telemetry    Sinus brady / NSR 40s (nocturnal) to 90s (personally reviewed)  Hospital Course    Nathaniel Lawson is a 42 y.o. male admitted for syncope and intermittent memory loss.   Assessment & Plan    Syncope With concomitant R-R and P-P prolongation cannot rule out vagal component. No further pauses here.  No immediate indication for pacing with potential vagal component.  Echo and EEG pending Continue cardiac monitoring while here.  Pt prefers to avoid procedures at this time.  Reviewed [facility] driving restrictions.  EP  follow up to be made. OK for home today if Echo WNL from our perspective.   For questions or updates, please contact [facility] Please consult [email or web address] for contact info under Cardiology/STEMI.  Signed, Nathaniel Freer, PA-C  03/22/2023, 7:10 AM   I have seen and examined this patient with Nathaniel Lawson.  Agree with above, note added to reflect my findings.  Patient feeling well today.  No further episodes of syncope.  Continues to have amnesia.  GEN: Well nourished, well developed, in no acute distress  HEENT: normal  Neck: no JVD, carotid bruits, or masses Cardiac: RRR; no murmurs, rubs, or gallops,no edema  Respiratory:  clear to auscultation bilaterally, normal work of breathing GI: soft, nontender, nondistended, + BS MS: no deformity or atrophy  Skin: warm and dry Neuro:  Strength and sensation are intact Psych: euthymic mood, full affect   Syncope: Had sinus pauses.  Pauses appear to have a vagal component with PCP and RR prolongation.  No further pauses.  Patient hopes to avoid pacemaker implant.  Nathaniel Lawson continue on telemetry.  Echo pending.  Reviewed driving restrictions.  No driving for 6 months.   Nathaniel Steinhaus M. Olanna Percifield MD 03/22/2023 8:47 AM

## 2023-03-22 NOTE — Discharge Summary (Signed)
Name: Nathaniel Lawson MRN: [id number] DOB: [birth year] 42 y.o. PCP: Tracey Harries, MD  Date of Admission: 03/21/2023  9:39 AM Date of Discharge: 03/22/2023 Attending Physician: Dr. Mercie Eon  Discharge Diagnosis: Principal Problem:   Syncope Active Problems:   Altered mental status    Discharge Medications: Allergies as of 03/22/2023   No Known Allergies      Medication List     TAKE these medications    cetirizine 10 MG tablet Commonly known as: ZYRTEC Take 10 mg by mouth daily as needed for allergies (during winter months).   MULTIVITAMIN ADULT PO Take 1 tablet by mouth daily.        Disposition and follow-up:   Nathaniel Lawson was discharged from [facility] in Stable condition.  At the hospital follow up visit please address:  1.  Follow-up:  a.  Syncope: Was thought to be vasovagal syncope.  Seizures ruled out.  Cardiogenic etiologies such as valvular etiology or bradycardia arrhythmia ruled out.  Follow-up cardiology outpatient.  No monitor outpatient per cardiology.  Continue monitor for further episodes.    b.  Hyperlipidemia: Continue lifestyle modifications.  Can follow-up with new lipid panel in the next 6 months  2.  Labs / imaging needed at time of follow-up: BMP, CBC  3.  Pending labs/ test needing follow-up: N/A  4.  Medication Changes  No new medications.  Follow-up Appointments:  Follow-up Information     Tracey Harries, MD. Call today.   Specialty: Family Medicine Why: Hospital folow up GO: AUGUST 5 AT 4:15PM Contact information: [address] [place] [address] [phone]         Graciella Freer, PA-C. Go on 04/28/2023.   Specialty: Cardiology Why: At 11:40am Contact information: [address] [place] [address] [phone]                 Hospital Course by problem list:  Nathaniel Lawson is a 42 y.o. person living with a history of asthma who  presents to the emergency department after syncopal episode.  Patient admitted for further evaluation and management of syncope.   #Syncope  Patient had multiple syncopal episodes prior to admission.  The first being at his home, which was not brought on, and suddenly occurred.  Patient not feel any prodrome.  Patient also then had 2 subsequent episodes during emergency department course.  There was some concern about seizure-like activity, versus syncopal episode.  Neurology was consulted as patient was altered.  Cardiology was also consulted as patient had episodes of asystole.  Labs were normal.  MRI was normal.  Echocardiogram was normal.  EKG did not show any concern for sinus pauses.  Patient did have some bradycardic episodes which are asymptomatic.  Patient worked with PT and did well.  No concerns.  Patient was followed by electrophysiologist who stated they did not think this was related to bradycardia arrhythmia.  Neurology did not think this was seizure or related to neurology.  There was thought that this episode was due to vasovagal syncope.  Cardiology did not recommend Zio patch or Holter monitor.  Neurology did not pursue any further workup.  Patient progressed well.  It was recommended that patient discharge and not drive for the next 6 months.  Patient has follow-up with electrophysiology.  Patient never required CPR, transcutneous pacing, or atropine  #HLD Patient has a past medical history of hyperlipidemia.  Total cholesterol 241, triglycerides 260, LDL 149  on 10/07/1022.  ASCVD risk score 6.1%.  No indication for statin at this time. Patient encouraged for lifestyle modifications.   #Asthma Patient does have past medical history of asthma.  No concern for exacerbation during hospital stay.    #Anxiety Patient states that he sometimes gets anxious, but no formal diagnosis of anxiety on chart.  No concerns during hospitalization.  Discharge Subjective: Patient states that he is  doing well.  He was a little concerned that he could not drive for the next 6 months.  He otherwise states that he could run up and down the hallway with no concerns.  He denies any chest pain, seizure-like activity, shortness of breath, or any other concerns this morning.  He states he is ready go home.  Discharge Exam:   BP 130/85 (BP Location: Left Arm)   Pulse 72   Temp 99.1 F (37.3 C) (Oral)   Resp 20   Ht 5\' 11"  (1.803 m)   Wt 92.6 kg   SpO2 98%   BMI 28.47 kg/m  Constitutional: Resting in recliner, no acute distress  HENT: normocephalic atraumatic  Eyes: conjunctiva non-erythematous Cardiovascular: regular rate and rhythm, no m/r/g Pulmonary/Chest: normal work of breathing on room air, lungs clear to auscultation bilaterally Abdominal: soft, non-tender, non-distended MSK: normal bulk and tone  Pertinent Labs, Studies, and Procedures:     Latest Ref Rng & Units 03/22/2023    6:33 AM 03/21/2023    9:52 AM  CBC  WBC 4.0 - 10.5 K/uL 6.8  5.3   Hemoglobin 13.0 - 17.0 g/dL 25.9  56.3   Hematocrit 39.0 - 52.0 % 41.6  43.4   Platelets 150 - 400 K/uL 222  216        Latest Ref Rng & Units 03/22/2023    6:33 AM 03/21/2023    9:52 AM  CMP  Glucose 70 - 99 mg/dL 875  643   BUN 6 - 20 mg/dL 8  13   Creatinine 3.29 - 1.24 mg/dL 5.18  8.41   Sodium 660 - 145 mmol/L 136  138   Potassium 3.5 - 5.1 mmol/L 3.8  3.9   Chloride 98 - 111 mmol/L 106  102   CO2 22 - 32 mmol/L 24  27   Calcium 8.9 - 10.3 mg/dL 9.1  9.7   Total Protein 6.5 - 8.1 g/dL  7.9   Total Bilirubin 0.3 - 1.2 mg/dL  0.6   Alkaline Phos 38 - 126 U/L  77   AST 15 - 41 U/L  20   ALT 0 - 44 U/L  18     MR Brain W and Wo Contrast  Result Date: 03/22/2023 CLINICAL DATA:  Provided history: Neuro deficit, acute, stroke suspected. Differential diagnosis includes occult lesion provoking LOC/seizure. EXAM: MRI HEAD WITHOUT AND WITH CONTRAST TECHNIQUE: Multiplanar, multiecho pulse sequences of the brain and surrounding  structures were obtained without and with intravenous contrast. CONTRAST:  10mL GADAVIST GADOBUTROL 1 MMOL/ML IV SOLN COMPARISON:  Non-contrast head CT 03/21/2023. FINDINGS: Brain: Cerebral volume is normal. No cortical encephalomalacia is identified. No significant cerebral white matter disease. No appreciable hippocampal size or signal asymmetry. There is no acute infarct. No evidence of an intracranial mass. No chronic intracranial blood products. No extra-axial fluid collection. No midline shift. No pathologic intracranial enhancement identified. Vascular: Maintained flow voids within the proximal large arterial vessels. 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 Skull and upper cervical spine: No focal  suspicious marrow lesion. Sinuses/Orbits: No mass or acute finding within the imaged orbits. 1.5 cm mucous retention cyst within the right maxillary sinus. 3 cm mucous retention cyst within the left maxillary sinus. IMPRESSION: 1. Unremarkable MRI appearance of the brain. No evidence of an acute intracranial abnormality. 2. No specific seizure etiology is identified. 3. 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 4. Bilateral maxillary sinus mucous retention cysts. Electronically Signed   By: Jackey Loge D.O.   On: 03/22/2023 11:24   ECHOCARDIOGRAM COMPLETE  Result Date: 03/22/2023    ECHOCARDIOGRAM REPORT   Patient Name:   Nathaniel Lawson Date of Exam: 03/22/2023 Medical Rec #:  [id number]        Height:       71.0 in Accession #:    [id number]       Weight:       204.1 lb Date of Birth:  [birth year]        BSA:          2.127 m Patient Age:    42 years         BP:           136/80 mmHg Patient Gender: M                HR:           66 bpm. Exam Location:  Inpatient Procedure: 2D Echo, Color Doppler and Cardiac Doppler Indications:    Syncope  History:        Patient has no prior history of Echocardiogram examinations.                 Signs/Symptoms:Syncope; Risk  Factors:Current Smoker.  Sonographer:    Raeford Razor Referring Phys: [id number] LAURA R INGOLD IMPRESSIONS  1. Left ventricular ejection fraction, by estimation, is 55 to 60%. The left ventricle has normal function. The left ventricle has no regional wall motion abnormalities. Left ventricular diastolic parameters were normal.  2. Right ventricular systolic function is normal. The right ventricular size is normal. Tricuspid regurgitation signal is inadequate for assessing PA pressure.  3. The mitral valve is normal in structure. No evidence of mitral valve regurgitation. No evidence of mitral stenosis.  4. The aortic valve is tricuspid. Aortic valve regurgitation is not visualized. No aortic stenosis is present.  5. The inferior vena cava is normal in size with greater than 50% respiratory variability, suggesting right atrial pressure of 3 mmHg. FINDINGS  Left Ventricle: Left ventricular ejection fraction, by estimation, is 55 to 60%. The left ventricle has normal function. The left ventricle has no regional wall motion abnormalities. The left ventricular internal cavity size was normal in size. There is  no left ventricular hypertrophy. Left ventricular diastolic parameters were normal. Right Ventricle: The right ventricular size is normal. No increase in right ventricular wall thickness. Right ventricular systolic function is normal. Tricuspid regurgitation signal is inadequate for assessing PA pressure. Left Atrium: Left atrial size was normal in size. Right Atrium: Right atrial size was normal in size. Pericardium: There is no evidence of pericardial effusion. Mitral Valve: The mitral valve is normal in structure. No evidence of mitral valve regurgitation. No evidence of mitral valve stenosis. Tricuspid Valve: The tricuspid valve is normal in structure. Tricuspid valve regurgitation is not demonstrated. Aortic Valve: The aortic valve is tricuspid. Aortic valve regurgitation is not visualized. No aortic stenosis is  present. Aortic valve peak gradient measures 6.8 mmHg. Pulmonic Valve: The pulmonic valve was normal  in structure. Pulmonic valve regurgitation is trivial. Aorta: The aortic root is normal in size and structure. Venous: The inferior vena cava is normal in size with greater than 50% respiratory variability, suggesting right atrial pressure of 3 mmHg. IAS/Shunts: No atrial level shunt detected by color flow Doppler.  LEFT VENTRICLE PLAX 2D LVIDd:         5.40 cm      Diastology LVIDs:         3.30 cm      LV e' medial:    10.10 cm/s LV PW:         0.90 cm      LV E/e' medial:  7.8 LV IVS:        0.90 cm      LV e' lateral:   15.40 cm/s LVOT diam:     1.80 cm      LV E/e' lateral: 5.1 LV SV:         59 LV SV Index:   28 LVOT Area:     2.54 cm  LV Volumes (MOD) LV vol d, MOD A2C: 116.0 ml LV vol d, MOD A4C: 130.0 ml LV vol s, MOD A2C: 46.5 ml LV vol s, MOD A4C: 51.9 ml LV SV MOD A2C:     69.5 ml LV SV MOD A4C:     130.0 ml LV SV MOD BP:      73.4 ml RIGHT VENTRICLE             IVC RV Basal diam:  3.00 cm     IVC diam: 1.10 cm RV S prime:     12.30 cm/s TAPSE (M-mode): 3.2 cm LEFT ATRIUM             Index        RIGHT ATRIUM           Index LA diam:        4.20 cm 1.97 cm/m   RA Area:     16.30 cm LA Vol (A2C):   50.7 ml 23.84 ml/m  RA Volume:   37.40 ml  17.58 ml/m LA Vol (A4C):   24.2 ml 11.38 ml/m LA Biplane Vol: 37.9 ml 17.82 ml/m  AORTIC VALVE AV Area (Vmax): 2.09 cm AV Vmax:        130.00 cm/s AV Peak Grad:   6.8 mmHg LVOT Vmax:      107.00 cm/s LVOT Vmean:     71.400 cm/s LVOT VTI:       0.233 m  AORTA Ao Root diam: 3.00 cm Ao Asc diam:  3.50 cm MITRAL VALVE MV Area (PHT): 4.08 cm    SHUNTS MV Decel Time: 186 msec    Systemic VTI:  0.23 m MV E velocity: 79.00 cm/s  Systemic Diam: 1.80 cm MV A velocity: 74.10 cm/s MV E/A ratio:  1.07 Dalton McleanMD Electronically signed by Wilfred Lacy Signature Date/Time: 03/22/2023/9:59:55 AM    Final    DG CHEST PORT 1 VIEW  Result Date: 03/21/2023 CLINICAL DATA:   Syncope. EXAM: PORTABLE CHEST 1 VIEW COMPARISON:  November 27, 2015. FINDINGS: The heart size and mediastinal contours are within normal limits. Both lungs are clear. The visualized skeletal structures are unremarkable. IMPRESSION: No active disease. Electronically Signed   By: Lupita Raider M.D.   On: 03/21/2023 14:48   EEG adult  Result Date: 03/21/2023 Benay Spice, MD     03/21/2023  2:29 PM [facility] Tele[facility] Consult Services Stat EEG Report Video  Performed: Performed : No clinical events. Alert and oriented. Demographics: Patient Name:   Nathaniel Lawson, Nathaniel Lawson Date of Birth:   August 06, 1981 Identification Number:   MRN - [id number] Study Times: Study Start Time:   03/21/2023 13:26:58 Study End Time:   03/21/2023 14:06:58 Duration:   40 minutes Indication(s): Syncope Technical Summary: This EEG was performed utilizing standard International 10-20 System of electrode placement. One channel electrocardiogram was monitored. Data were obtained and interpreted utilizing referential montage recording, with reformatting to longitudinal, transverse bipolar, and referential montages as necessary for interpretation. State(s):       Awake      Drowsy Activation Procedures: Hyperventilation: Not performed Photic Stimulation: Not performed EEG Description: Posterior dominant is medium to high amplitude alpha rhythm in the 8-11 hz range. No sleep state is identified. There is no generalized or focal slowing or epileptiform discharges. Video without clinical events. Activation procedures not performed. Impression: This is a normal EEG. A normal EEG does not exclude a diagnosis of seizure disorder. Clinical correlation is advised. Dr Benay Spice [facility] For Inpatient follow-up with [facility] physician please call [facility] [phone]. This is not an outpatient service. Post hospital discharge, please contact hospital directly. Please call or reconsult our service if there are any  clinical or diagnostic changes.  CT HEAD CODE STROKE WO CONTRAST  Addendum Date: 03/21/2023   ADDENDUM REPORT: 03/21/2023 10:29 ADDENDUM: Study discussed by telephone with Dr. Lebron Conners PFEIFFER on 03/21/2023 at 1025 hours. Electronically Signed   By: Odessa Fleming M.D.   On: 03/21/2023 10:29   Result Date: 03/21/2023 CLINICAL DATA:  Code stroke.  42 year old male found down. EXAM: CT HEAD WITHOUT CONTRAST TECHNIQUE: Contiguous axial images were obtained from the base of the skull through the vertex without intravenous contrast. RADIATION DOSE REDUCTION: This exam was performed according to the departmental dose-optimization program which includes automated exposure control, adjustment of the mA and/or kV according to patient size and/or use of iterative reconstruction technique. COMPARISON:  None Available. FINDINGS: Brain: Normal cerebral volume. No midline shift, ventriculomegaly, mass effect, evidence of mass lesion, intracranial hemorrhage or evidence of cortically based acute infarction. Gray-white matter differentiation is within normal limits throughout the brain. Vascular: No suspicious intracranial vascular hyperdensity. Skull: Negative. Sinuses/Orbits: Partially visible left maxillary sinus mucous retention cyst, other Visualized paranasal sinuses and mastoids are clear. Other: Visualized orbits and scalp soft tissues are within normal limits. ASPECTS [facility] Stroke Program Early CT Score) Total score (0-10 with 10 being normal): 10 IMPRESSION: Normal noncontrast CT appearance of the brain.  ASPECTS 10. Electronically Signed: By: Odessa Fleming M.D. On: 03/21/2023 10:20     Discharge Instructions: Discharge Instructions     Call MD for:  difficulty breathing, headache or visual disturbances   Complete by: As directed    Call MD for:  persistant dizziness or light-headedness   Complete by: As directed    Call MD for:  redness, tenderness, or signs of infection (pain, swelling, redness, odor or green/yellow  discharge around incision site)   Complete by: As directed    Call MD for:  severe uncontrolled pain   Complete by: As directed    Diet - low sodium heart healthy   Complete by: As directed    Diet - low sodium heart healthy   Complete by: As directed    Discharge instructions   Complete by: As directed    Dear Mr Hollister, Beazley were admitted on 28 July due to concerns of syncope.  You were  admitted, cardiology and neurology were both consulted.  The workup including heart ultrasound  and MRI of your brain did not show any abnormalities in the structure of your heart or its electrical conductivity.  He a few things you would like you to follow up on.  - You have an appointment with your cardiologist on 04/28/2023 @ 11:40 am - Avoid operating a motor vehicle until after 6 months after you are cleared by a cardiologist which everyone comes soon - We didn't make any medications changes at this time   Thank you for allowing [place] to see you.   Thank you, Dr. Mickie Bail   Increase activity slowly   Complete by: As directed    Increase activity slowly   Complete by: As directed        Signed: Modena Slater, DO 03/22/2023, 6:15 PM   Pager: [phone] Internal Medicine Resident PGY-2

## 2023-03-22 NOTE — TOC Initial Note (Signed)
Transition of Care [facility]) - Initial/Assessment Note    Patient Details  Name: Nathaniel Lawson MRN: [id number] Date of Birth: 18-Apr-1981  Transition of Care [facility]) CM/SW Contact:    Leone Haven, RN Phone Number: 03/22/2023, 10:46 AM  Clinical Narrative:                 Patient is from home with wife, wife at the bedside, she states  his PCP is Dr. Everlene Other, he has insurance on file and he has no DME.  Wife will transport him home at dc, she is his support system as well.  [facility] [address], pta he is self ambulatory.    Expected Discharge Plan: Home/Self Care Barriers to Discharge: Continued Medical Work up   Patient Goals and CMS Choice     Choice offered to / list presented to : NA      Expected Discharge Plan and Services In-house Referral: NA Discharge Planning Services: CM Consult Post Acute Care Choice: NA Living arrangements for the past 2 months: Single Family Home                 DME Arranged: N/A DME Agency: NA       HH Arranged: NA          Prior Living Arrangements/Services Living arrangements for the past 2 months: Single Family Home Lives with:: Spouse Patient language and need for interpreter reviewed:: Yes Do you feel safe going back to the place where you live?: Yes      Need for Family Participation in Patient Care: Yes (Comment) Care giver support system in place?: Yes (comment)   Criminal Activity/Legal Involvement Pertinent to Current Situation/Hospitalization: No - Comment as needed  Activities of Daily Living Home Assistive Devices/Equipment: None ADL Screening (condition at time of admission) Patient's cognitive ability adequate to safely complete daily activities?: Yes Is the patient deaf or have difficulty hearing?: No Does the patient have difficulty seeing, even when wearing glasses/contacts?: No Does the patient have difficulty concentrating, remembering, or making  decisions?: No Patient able to express need for assistance with ADLs?: Yes Does the patient have difficulty dressing or bathing?: No Independently performs ADLs?: Yes (appropriate for developmental age) Does the patient have difficulty walking or climbing stairs?: No Weakness of Legs: None Weakness of Arms/Hands: None  Permission Sought/Granted Permission sought to share information with : Case Manager Permission granted to share information with : Yes, Verbal Permission Granted              Emotional Assessment       Orientation: : Oriented to Self, Oriented to Place, Oriented to  Time, Oriented to Situation Alcohol / Substance Use: Not Applicable Psych Involvement: No (comment)  Admission diagnosis:  Syncope and collapse [R55] Syncope [R55] Altered mental status, unspecified altered mental status type [R41.82] Patient Active Problem List   Diagnosis Date Noted   Syncope 03/21/2023   OSA (obstructive sleep apnea) 08/27/2022   Seasonal and perennial allergic rhinitis 08/27/2022   PCP:  Tracey Harries, MD Pharmacy:   [phone] - [place], [place] - [address] [place] [address] Phone: [phone] Fax: [phone]     Social Determinants of Health (SDOH) Social History: SDOH Screenings   Food Insecurity: No Food Insecurity (03/21/2023)  Housing: Low Risk  (03/21/2023)  Transportation Needs: No Transportation Needs (03/21/2023)  Utilities: Not At Risk (03/21/2023)  Depression (PHQ2-9): Low Risk  (09/22/2021)  Financial Resource Strain: Low Risk  (10/05/2022)   Received from [facility], [facility]  Physical Activity: Insufficiently Active (10/05/2022)   Received from [facility], [facility]  Social Connections: Moderately Integrated (10/05/2022)   Received from [facility], [facility]  Stress: No Stress Concern Present (10/05/2022)   Received from [facility], [facility]  Tobacco Use: High Risk (03/21/2023)    SDOH Interventions:     Readmission Risk Interventions     No data to display

## 2023-03-22 NOTE — Progress Notes (Signed)
Arta Silence to be D/C'd Home per MD order.  Discussed with the patient and all questions fully answered.  VSS, Skin clean, dry and intact without evidence of skin break down, no evidence of skin tears noted. IV catheters discontinued intact. Site without signs and symptoms of complications. Dressing and pressure applied.  An After Visit Summary was printed and given to the patient.   D/c education completed with patient/family including follow up instructions, medication list, d/c activities limitations if indicated, with other d/c instructions as indicated by MD - patient able to verbalize understanding, all questions fully answered.   Patient instructed to return to ED, call 911, or call MD for any changes in condition.   Patient escorted via WC, and D/C home via private auto.  Pauletta Browns 03/22/2023 5:43 PM

## 2023-03-22 NOTE — TOC Transition Note (Signed)
Transition of Care [facility]) - CM/SW Discharge Note   Patient Details  Name: Nathaniel Lawson MRN: [id number] Date of Birth: 01/01/1981  Transition of Care [facility]) CM/SW Contact:  Leone Haven, RN Phone Number: 03/22/2023, 4:34 PM   Clinical Narrative:    Patient is for dc today, he has no needs. Wife will transport him home.   Final next level of care: Home/Self Care Barriers to Discharge: Continued Medical Work up   Patient Goals and CMS Choice   Choice offered to / list presented to : NA  Discharge Placement                         Discharge Plan and Services Additional resources added to the After Visit Summary for   In-house Referral: NA Discharge Planning Services: CM Consult Post Acute Care Choice: NA          DME Arranged: N/A DME Agency: NA       HH Arranged: NA          Social Determinants of Health (SDOH) Interventions SDOH Screenings   Food Insecurity: No Food Insecurity (03/21/2023)  Housing: Low Risk  (03/21/2023)  Transportation Needs: No Transportation Needs (03/21/2023)  Utilities: Not At Risk (03/21/2023)  Depression (PHQ2-9): Low Risk  (09/22/2021)  Financial Resource Strain: Low Risk  (10/05/2022)   Received from [facility], [facility]  Physical Activity: Insufficiently Active (10/05/2022)   Received from [facility], [facility]  Social Connections: Moderately Integrated (10/05/2022)   Received from [facility], [facility]  Stress: No Stress Concern Present (10/05/2022)   Received from [facility], [facility]  Tobacco Use: High Risk (03/21/2023)     Readmission Risk Interventions     No data to display

## 2023-03-22 NOTE — Progress Notes (Signed)
Echocardiogram 2D Echocardiogram has been performed.  Nathaniel Lawson 03/22/2023, 9:38 AM

## 2023-04-27 NOTE — Progress Notes (Signed)
  Electrophysiology Office Note:   Date:  04/28/2023  ID:  Nathaniel Lawson, DOB October 18, 1980, MRN [id number]  Primary Cardiologist: None Electrophysiologist: Will Jorja Loa, MD      History of Present Illness:   Nathaniel Lawson is a 42 y.o. male with h/o syncope seen today for post hospital follow up.    Admitted 7/28 with syncope. Did not remember the episode. He was in his usual state of health according to his family. He had made his son breakfast this morning and was sitting down eating his own breakfast. His wife saw him fall to the floor. According to his wife, he hit his cheek but that was not the first thing that hit the floor. She feels that he landed on his chest. He was unconscious for a few seconds, began making noise, and then stopped making noise for another 10 seconds. She feels that he was unconscious for 20 seconds. He had further episodes of syncope in the ER. Review of telemetry showed a pause > 5 seconds with sinus slowing preceding the episode.   Prior syncopal spells clearly involved heat, dehydration, or injury per pt.   Since discharge from hospital the patient reports doing well. He had 1-2 days after discharge where he would have brief periods of "hot flashes".  he denies chest pain, palpitations, dyspnea, PND, orthopnea, nausea, vomiting, dizziness, syncope, edema, weight gain, or early satiety.   Prior syncope(s) - Each years apart, most recent prior to this event was 2019/2020 2x standing in formation 1x when examining a spider bite 1x when urinating  Review of systems complete and found to be negative unless listed in HPI.   EP Information / Studies Reviewed:    EKG is ordered today. Personal review as below.  EKG Interpretation Date/Time:  Wednesday April 28 2023 11:45:58 EDT Ventricular Rate:  54 PR Interval:  158 QRS Duration:  92 QT Interval:  410 QTC Calculation: 388 R Axis:   58  Text Interpretation: Sinus bradycardia Confirmed  by Maxine Glenn [phone]) on 04/28/2023 11:49:30 AM    Physical Exam:   VS:  BP (!) 152/100   Pulse (!) 54   Ht 5\' 11"  (1.803 m)   Wt 213 lb (96.6 kg)   SpO2 98%   BMI 29.71 kg/m    Wt Readings from Last 3 Encounters:  04/28/23 213 lb (96.6 kg)  03/22/23 204 lb 1.6 oz (92.6 kg)  12/29/22 214 lb 3.2 oz (97.2 kg)     GEN: Well nourished, well developed in no acute distress NECK: No JVD; No carotid bruits CARDIAC: Regular rate and rhythm, no murmurs, rubs, gallops RESPIRATORY:  Clear to auscultation without rales, wheezing or rhonchi  ABDOMEN: Soft, non-tender, non-distended EXTREMITIES:  No edema; No deformity   ASSESSMENT AND PLAN:    Syncope Suspected Vasovagal Pauses appear to have a vagal component with P-P and R-R prolongation Reviewed that indications for pacing are limited and pacing is unlikely to provide complete relief of symptoms Avoid triggers and with prodrome should lie flat and lift feet.  Avoid AV nodal agents.     Follow up with Dr. Elberta Fortis in 3 months  Signed, Graciella Freer, PA-C

## 2023-04-28 ENCOUNTER — Encounter: Payer: Self-pay | Admitting: Student

## 2023-04-28 ENCOUNTER — Ambulatory Visit: Attending: Student | Admitting: Student

## 2023-04-28 VITALS — BP 152/100 | HR 54 | Ht 71.0 in | Wt 213.0 lb

## 2023-04-28 DIAGNOSIS — R55 Syncope and collapse: Secondary | ICD-10-CM | POA: Diagnosis not present

## 2023-04-28 NOTE — Patient Instructions (Signed)
Medication Instructions:  Your physician recommends that you continue on your current medications as directed. Please refer to the Current Medication list given to you today.  *If you need a refill on your cardiac medications before your next appointment, please call your pharmacy*  Lab Work: None ordered If you have labs (blood work) drawn today and your tests are completely normal, you will receive your results only by: MyChart Message (if you have MyChart) OR A paper copy in the mail If you have any lab test that is abnormal or we need to change your treatment, we will call you to review the results.  Follow-Up: At [facility], you and your health needs are our priority.  As part of our continuing mission to provide you with exceptional heart care, we have created designated Provider Care Teams.  These Care Teams include your primary Cardiologist (physician) and Advanced Practice Providers (APPs -  Physician Assistants and Nurse Practitioners) who all work together to provide you with the care you need, when you need it.  Your next appointment:   3 month(s)  Provider:   Will Camnitz, MD  

## 2023-04-29 ENCOUNTER — Encounter: Payer: Self-pay | Admitting: Internal Medicine

## 2023-04-30 ENCOUNTER — Encounter: Payer: Self-pay | Admitting: Student

## 2023-05-05 ENCOUNTER — Encounter: Payer: Self-pay | Admitting: Internal Medicine

## 2023-08-11 ENCOUNTER — Ambulatory Visit: Admitting: Cardiology

## 2023-09-02 ENCOUNTER — Encounter: Payer: Self-pay | Admitting: Cardiology

## 2023-09-02 ENCOUNTER — Ambulatory Visit: Attending: Cardiology | Admitting: Cardiology

## 2023-09-02 VITALS — BP 120/78 | HR 98 | Ht 71.0 in | Wt 213.0 lb

## 2023-09-02 DIAGNOSIS — R002 Palpitations: Secondary | ICD-10-CM

## 2023-09-02 DIAGNOSIS — R55 Syncope and collapse: Secondary | ICD-10-CM

## 2023-09-02 NOTE — Progress Notes (Signed)
 Electrophysiology Office Note:   Date:  09/02/2023  ID:  Nathaniel Lawson, DOB [birth year], MRN [id number]  Primary Cardiologist: None Primary Heart Failure: None Electrophysiologist: Cindee Mclester Gladis Norton, MD      History of Present Illness:   Nathaniel Lawson is a 43 y.o. male with h/o syncope seen today for routine electrophysiology followup.       Admitted 7/28 with syncope. Did not remember the episode. He was in his usual state of health according to his family. He had made his son breakfast this morning and was sitting down eating his own breakfast. His wife saw him fall to the floor. According to his wife, he hit his cheek but that was not the first thing that hit the floor. She feels that he landed on his chest. He was unconscious for a few seconds, began making noise, and then stopped making noise for another 10 seconds. She feels that he was unconscious for 20 seconds. He had further episodes of syncope in the ER. Review of telemetry showed a pause > 5 seconds with sinus slowing preceding the episode.   Prior syncopal spells clearly involved heat, dehydration, or injury per pt.         Since last being seen in our clinic the patient reports no further episodes of syncope.  He has been feeling well without complaint.  He was recently diagnosed with ADD.  He was started on Strattera at.  Since starting this medication, he has noted an increase in his resting heart rate into the 90s.  Additionally, when he exercises, his heart rate gets into the 180s.  This is quite a bit more elevated than prior to starting the medication.  he denies chest pain, palpitations, dyspnea, PND, orthopnea, nausea, vomiting, dizziness, syncope, edema, weight gain, or early satiety.   Review of systems complete and found to be negative unless listed in HPI.   EP Information / Studies Reviewed:    EKG is not ordered today. EKG from 04/28/23 reviewed which showed sinus rhythm        Risk  Assessment/Calculations:              Physical Exam:   VS:  BP 120/78 (BP Location: Left Arm, Patient Position: Sitting, Cuff Size: Large)   Pulse 98   Ht 5' 11 (1.803 m)   Wt 213 lb (96.6 kg)   SpO2 97%   BMI 29.71 kg/m    Wt Readings from Last 3 Encounters:  09/02/23 213 lb (96.6 kg)  04/28/23 213 lb (96.6 kg)  03/22/23 204 lb 1.6 oz (92.6 kg)     GEN: Well nourished, well developed in no acute distress NECK: No JVD; No carotid bruits CARDIAC: Regular rate and rhythm, no murmurs, rubs, gallops RESPIRATORY:  Clear to auscultation without rales, wheezing or rhonchi  ABDOMEN: Soft, non-tender, non-distended EXTREMITIES:  No edema; No deformity   ASSESSMENT AND PLAN:    1.  Syncope: Suspect due to vasovagal episode.  Appeared to have a vagal component with P to P and R-R prolongation.  Continue to avoid AV nodal blockers.  2.  Palpitations: Patient was diagnosed with ADD heart rate has been elevated since taking Strattera.  He has stopped this medication, though his heart rate remains elevated.  In sinus rhythm now with heart rate of 71.  He Nathaniel Lawson wear a cardiac monitor in 1 week if this does not improve.  Follow up with EP APP in 6 months  Signed, Felicia Both Gladis Norton, MD

## 2023-09-02 NOTE — Patient Instructions (Signed)
 Medication Instructions:  Your physician recommends that you continue on your current medications as directed. Please refer to the Current Medication list given to you today.  *If you need a refill on your cardiac medications before your next appointment, please call your pharmacy*   Lab Work: None ordered   Testing/Procedures: None ordered   Follow-Up: At [facility], you and your health needs are our priority.  As part of our continuing mission to provide you with exceptional heart care, we have created designated Provider Care Teams.  These Care Teams include your primary Cardiologist (physician) and Advanced Practice Providers (APPs -  Physician Assistants and Nurse Practitioners) who all work together to provide you with the care you need, when you need it.  Your next appointment:   6 month(s)  The format for your next appointment:   In Person  Provider:   You will see one of the following Advanced Practice Providers on your designated Care Team:   Francis Dowse, [place] "Mardelle Matte" [place], [place] Canary Brim, NP   Thank you for choosing [facility]!!   Dory Horn, RN [phone]

## 2023-09-14 ENCOUNTER — Encounter: Payer: Self-pay | Admitting: Cardiology

## 2023-09-16 ENCOUNTER — Telehealth: Payer: Self-pay | Admitting: Cardiology

## 2023-09-16 ENCOUNTER — Ambulatory Visit: Attending: Cardiology

## 2023-09-16 DIAGNOSIS — R002 Palpitations: Secondary | ICD-10-CM

## 2023-09-16 NOTE — Telephone Encounter (Signed)
Pt aware ok for a monitor if patient chooses. Pt agreeable. Will send him some information via mychart. Pt understands monitor will be mailed to him. Patient verbalized understanding and agreeable to plan.

## 2023-09-16 NOTE — Progress Notes (Unsigned)
Enrolled for [employer] to mail a ZIO XT long term holter monitor to the patients address on file.  

## 2023-09-16 NOTE — Telephone Encounter (Signed)
  Pt was seen recently in 09/02/23. Spoke with pt, still having elevated HR. Also, pt send mychart message on 09/14/23 and would like to follow up. He would like to get a call back today if possible

## 2023-09-16 NOTE — Telephone Encounter (Signed)
Called pt.  See today's telephone note

## 2023-09-16 NOTE — Telephone Encounter (Signed)
Pt wants to schedule f/u appt with Dr Elberta Fortis for ^ HR

## 2023-09-21 DIAGNOSIS — R002 Palpitations: Secondary | ICD-10-CM | POA: Diagnosis not present

## 2023-10-18 ENCOUNTER — Telehealth: Payer: Self-pay | Admitting: *Deleted

## 2023-10-18 NOTE — Telephone Encounter (Signed)
-----   Message from Will [facility] sent at 10/14/2023  4:32 PM EST ----- Sinus rhythm and sinus tachycardia, no arrhythmias

## 2023-10-18 NOTE — Telephone Encounter (Signed)
 Left message to call back

## 2023-10-19 ENCOUNTER — Encounter: Payer: Self-pay | Admitting: Cardiology

## 2023-10-19 NOTE — Telephone Encounter (Signed)
 Pt made aware of findings and verbalized understanding. States he thinks that "Straterra affected my vagal nerve".  He would like to try some techniques to help with this but not sure what they are. Aware forwarding to MD for recommended techniques (ex: deep breathing, cold exposure). Pt understands I will respond to him via mychart once advised on.

## 2023-10-26 NOTE — Telephone Encounter (Signed)
 Left detailed message informing pt both forms completed and signed by MD. Aware there were left at front desk for him to pick up anytime.

## 2023-10-28 ENCOUNTER — Encounter: Payer: Self-pay | Admitting: Cardiology
